# Patient Record
Sex: Male | Born: 1965
Health system: Southern US, Community
[De-identification: ages and names within clinical notes are randomized; demographics above are authoritative.]

## PROBLEM LIST (undated history)

## (undated) DIAGNOSIS — F329 Major depressive disorder, single episode, unspecified: Secondary | ICD-10-CM

## (undated) DIAGNOSIS — Z8619 Personal history of other infectious and parasitic diseases: Secondary | ICD-10-CM

## (undated) DIAGNOSIS — K219 Gastro-esophageal reflux disease without esophagitis: Secondary | ICD-10-CM

## (undated) DIAGNOSIS — M549 Dorsalgia, unspecified: Secondary | ICD-10-CM

## (undated) DIAGNOSIS — G8929 Other chronic pain: Secondary | ICD-10-CM

## (undated) DIAGNOSIS — F32A Depression, unspecified: Secondary | ICD-10-CM

## (undated) HISTORY — DX: Major depressive disorder, single episode, unspecified: F32.9

## (undated) HISTORY — DX: Depression, unspecified: F32.A

## (undated) HISTORY — PX: NOSE SURGERY: SHX723

## (undated) HISTORY — PX: KNEE SURGERY: SHX244

## (undated) HISTORY — DX: Gastro-esophageal reflux disease without esophagitis: K21.9

## (undated) HISTORY — DX: Personal history of other infectious and parasitic diseases: Z86.19

---

## 1998-01-29 ENCOUNTER — Emergency Department (HOSPITAL_COMMUNITY): Admission: EM | Admit: 1998-01-29 | Discharge: 1998-01-29 | Payer: Self-pay | Admitting: Emergency Medicine

## 1998-08-19 ENCOUNTER — Emergency Department (HOSPITAL_COMMUNITY): Admission: EM | Admit: 1998-08-19 | Discharge: 1998-08-19 | Payer: Self-pay | Admitting: Emergency Medicine

## 1998-08-19 ENCOUNTER — Encounter: Payer: Self-pay | Admitting: Emergency Medicine

## 1998-08-20 ENCOUNTER — Encounter: Payer: Self-pay | Admitting: Orthopedic Surgery

## 1998-08-20 ENCOUNTER — Ambulatory Visit (HOSPITAL_COMMUNITY): Admission: RE | Admit: 1998-08-20 | Discharge: 1998-08-20 | Payer: Self-pay | Admitting: Orthopedic Surgery

## 2000-10-26 ENCOUNTER — Encounter: Admission: RE | Admit: 2000-10-26 | Discharge: 2000-10-26 | Payer: Self-pay | Admitting: Family Medicine

## 2002-01-07 ENCOUNTER — Encounter: Admission: RE | Admit: 2002-01-07 | Discharge: 2002-01-07 | Payer: Self-pay | Admitting: Family Medicine

## 2002-02-04 ENCOUNTER — Encounter: Admission: RE | Admit: 2002-02-04 | Discharge: 2002-02-04 | Payer: Self-pay | Admitting: Family Medicine

## 2003-03-17 ENCOUNTER — Encounter: Admission: RE | Admit: 2003-03-17 | Discharge: 2003-03-17 | Payer: Self-pay | Admitting: Family Medicine

## 2003-04-01 ENCOUNTER — Emergency Department (HOSPITAL_COMMUNITY): Admission: EM | Admit: 2003-04-01 | Discharge: 2003-04-01 | Payer: Self-pay | Admitting: *Deleted

## 2003-04-01 ENCOUNTER — Encounter: Payer: Self-pay | Admitting: Emergency Medicine

## 2003-04-07 ENCOUNTER — Encounter: Admission: RE | Admit: 2003-04-07 | Discharge: 2003-04-16 | Payer: Self-pay | Admitting: Orthopaedic Surgery

## 2004-09-29 ENCOUNTER — Emergency Department (HOSPITAL_COMMUNITY): Admission: EM | Admit: 2004-09-29 | Discharge: 2004-09-29 | Payer: Self-pay | Admitting: Emergency Medicine

## 2005-03-12 ENCOUNTER — Emergency Department (HOSPITAL_COMMUNITY): Admission: EM | Admit: 2005-03-12 | Discharge: 2005-03-12 | Payer: Self-pay | Admitting: Emergency Medicine

## 2006-01-17 ENCOUNTER — Emergency Department (HOSPITAL_COMMUNITY): Admission: EM | Admit: 2006-01-17 | Discharge: 2006-01-18 | Payer: Self-pay | Admitting: Emergency Medicine

## 2006-12-08 ENCOUNTER — Inpatient Hospital Stay (HOSPITAL_COMMUNITY): Admission: EM | Admit: 2006-12-08 | Discharge: 2006-12-08 | Payer: Self-pay | Admitting: *Deleted

## 2009-05-03 ENCOUNTER — Emergency Department (HOSPITAL_COMMUNITY): Admission: EM | Admit: 2009-05-03 | Discharge: 2009-05-03 | Payer: Self-pay | Admitting: Emergency Medicine

## 2009-08-11 ENCOUNTER — Ambulatory Visit: Payer: Self-pay | Admitting: Diagnostic Radiology

## 2009-08-11 ENCOUNTER — Emergency Department (HOSPITAL_BASED_OUTPATIENT_CLINIC_OR_DEPARTMENT_OTHER): Admission: EM | Admit: 2009-08-11 | Discharge: 2009-08-11 | Payer: Self-pay | Admitting: Emergency Medicine

## 2009-08-12 ENCOUNTER — Emergency Department (HOSPITAL_BASED_OUTPATIENT_CLINIC_OR_DEPARTMENT_OTHER): Admission: EM | Admit: 2009-08-12 | Discharge: 2009-08-12 | Payer: Self-pay | Admitting: Emergency Medicine

## 2010-09-28 ENCOUNTER — Emergency Department (HOSPITAL_BASED_OUTPATIENT_CLINIC_OR_DEPARTMENT_OTHER)
Admission: EM | Admit: 2010-09-28 | Discharge: 2010-09-28 | Disposition: A | Discharge status: Home / Self Care | Payer: Self-pay | Attending: Emergency Medicine | Admitting: Emergency Medicine

## 2010-09-28 ENCOUNTER — Emergency Department (INDEPENDENT_AMBULATORY_CARE_PROVIDER_SITE_OTHER): Payer: Self-pay

## 2010-09-28 DIAGNOSIS — K2990 Gastroduodenitis, unspecified, without bleeding: Secondary | ICD-10-CM | POA: Insufficient documentation

## 2010-09-28 DIAGNOSIS — K2970 Gastritis, unspecified, without bleeding: Secondary | ICD-10-CM | POA: Insufficient documentation

## 2010-09-28 DIAGNOSIS — R0789 Other chest pain: Secondary | ICD-10-CM | POA: Insufficient documentation

## 2010-09-28 DIAGNOSIS — R079 Chest pain, unspecified: Secondary | ICD-10-CM

## 2010-09-28 LAB — BASIC METABOLIC PANEL
BUN: 17 mg/dL (ref 6–23)
Calcium: 10.4 mg/dL (ref 8.4–10.5)
Chloride: 107 mEq/L (ref 96–112)
Creatinine, Ser: 1.4 mg/dL (ref 0.4–1.5)
GFR calc non Af Amer: 56 mL/min — ABNORMAL LOW (ref >60)
Glucose, Bld: 112 mg/dL — ABNORMAL HIGH (ref 70–99)
Potassium: 3.9 mEq/L (ref 3.5–5.1)

## 2010-09-28 LAB — POCT CARDIAC MARKERS
CKMB, poc: 2 ng/mL (ref 1.0–8.0)
Troponin i, poc: <0.05 ng/mL (ref 0.00–0.09)
Troponin i, poc: <0.05 ng/mL (ref 0.00–0.09)

## 2010-09-28 LAB — CBC
Hemoglobin: 15.6 g/dL (ref 13.0–17.0)
MCH: 30.1 pg (ref 26.0–34.0)
MCV: 84.6 fL (ref 78.0–100.0)
RBC: 5.18 MIL/uL (ref 4.22–5.81)

## 2010-09-28 LAB — URINALYSIS, ROUTINE W REFLEX MICROSCOPIC
Nitrite: NEGATIVE
Protein, ur: 100 mg/dL — AB
Specific Gravity, Urine: 1.039 — ABNORMAL HIGH (ref 1.005–1.030)

## 2010-09-28 LAB — DIFFERENTIAL
Basophils Absolute: 0 10*3/uL (ref 0.0–0.1)
Eosinophils Relative: 1 % (ref 0–5)
Lymphocytes Relative: 29 % (ref 12–46)
Lymphs Abs: 3.4 10*3/uL (ref 0.7–4.0)

## 2010-09-28 LAB — D-DIMER, QUANTITATIVE: D-Dimer, Quant: <0.22 ug/mL-FEU (ref 0.00–0.48)

## 2010-09-28 LAB — URINE MICROSCOPIC-ADD ON

## 2010-11-14 LAB — CBC
HCT: 43.2 % (ref 39.0–52.0)
MCHC: 35.4 g/dL (ref 30.0–36.0)
MCV: 89 fL (ref 78.0–100.0)
Platelets: 346 10*3/uL (ref 150–400)
RDW: 12.8 % (ref 11.5–15.5)

## 2010-11-14 LAB — URINALYSIS, ROUTINE W REFLEX MICROSCOPIC
Bilirubin Urine: NEGATIVE
Leukocytes, UA: NEGATIVE
Nitrite: NEGATIVE
Protein, ur: 100 mg/dL — AB
Urobilinogen, UA: 0.2 mg/dL (ref 0.0–1.0)
pH: 5.5 (ref 5.0–8.0)

## 2010-11-14 LAB — COMPREHENSIVE METABOLIC PANEL
ALT: 46 U/L (ref 0–53)
AST: 32 U/L (ref 0–37)
Albumin: 4.8 g/dL (ref 3.5–5.2)
Alkaline Phosphatase: 60 U/L (ref 39–117)
BUN: 12 mg/dL (ref 6–23)
GFR calc non Af Amer: >60 mL/min (ref >60)
Total Protein: 8.3 g/dL (ref 6.0–8.3)

## 2010-11-14 LAB — DIFFERENTIAL
Basophils Absolute: 0.2 10*3/uL — ABNORMAL HIGH (ref 0.0–0.1)
Basophils Relative: 1 % (ref 0–1)
Eosinophils Relative: 1 % (ref 0–5)
Lymphs Abs: 1.9 10*3/uL (ref 0.7–4.0)
Monocytes Relative: 4 % (ref 3–12)
Neutrophils Relative %: 79 % — ABNORMAL HIGH (ref 43–77)

## 2010-11-18 LAB — DIFFERENTIAL
Basophils Relative: 1 % (ref 0–1)
Lymphocytes Relative: 34 % (ref 12–46)
Neutro Abs: 5 10*3/uL (ref 1.7–7.7)

## 2010-11-18 LAB — COMPREHENSIVE METABOLIC PANEL
AST: 30 U/L (ref 0–37)
CO2: 25 mEq/L (ref 19–32)
Chloride: 106 mEq/L (ref 96–112)
Creatinine, Ser: 1.22 mg/dL (ref 0.4–1.5)
Glucose, Bld: 104 mg/dL — ABNORMAL HIGH (ref 70–99)
Sodium: 137 mEq/L (ref 135–145)
Total Bilirubin: 0.7 mg/dL (ref 0.3–1.2)

## 2010-11-18 LAB — URINE MICROSCOPIC-ADD ON

## 2010-11-18 LAB — URINALYSIS, ROUTINE W REFLEX MICROSCOPIC
Ketones, ur: NEGATIVE mg/dL
Protein, ur: 100 mg/dL — AB
Specific Gravity, Urine: 1.024 (ref 1.005–1.030)
Urobilinogen, UA: 0.2 mg/dL (ref 0.0–1.0)

## 2010-11-18 LAB — CBC
MCHC: 35.5 g/dL (ref 30.0–36.0)
MCV: 90.7 fL (ref 78.0–100.0)
Platelets: 340 10*3/uL (ref 150–400)
RBC: 4.85 MIL/uL (ref 4.22–5.81)
RDW: 13.1 % (ref 11.5–15.5)

## 2010-11-18 LAB — LIPASE, BLOOD: Lipase: 33 U/L (ref 11–59)

## 2010-12-30 NOTE — H&P (Signed)
NAMETYRELL, SEIFER NO.:  [id number]   MEDICAL RECORD NO.:  [id number]          PATIENT TYPE:  EMS   LOCATION:  [facility]                         FACILITY:  [facility]   PHYSICIAN:  Lonia Blood, M.D.       DATE OF BIRTH:  May 24, 1966   DATE OF ADMISSION:  12/07/2006  DATE OF DISCHARGE:                              HISTORY & PHYSICAL   PRIMARY CARE PHYSICIAN:  None.   CHIEF COMPLAINTS:  Chest pain.   HISTORY OF PRESENT ILLNESS:  Mr. Wah is a 45 year old gentleman  without any significant past medical history who tonight was smoking  some crack when all of the sudden started having retrosternal chest  pressure associated with diaphoresis and dyspnea.  The patient became  apprehensive and called 9-1-1.  EMS promptly arrived to give him a  nitroglycerin sublingually and the patient's chest pain improved  immediately. The patient was brought to the emergency room and currently  he is without any chest pain.  The patient smokes a pack of cigarettes a  day.  He does not have any strong family history for early coronary  artery disease.   PAST MEDICAL HISTORY:  None.   MEDICATIONS:  None.   SOCIAL HISTORY:  The patient smokes one pack of cigarettes a day.  He  drinks alcohol, about a six pack of beer a day.  He is married with two  children.  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   FAMILY HISTORY:  The patient's mother is alive at age 59 without  significant medical problems.  The patient's father is alive at age 45  without significant medical problems.  The patient has a brother and a  sister both healthy.   REVIEW OF SYSTEMS:  As per HPI, otherwise positive for some occasional  right-sided abdominal cramps and for some joint pains and minor bruises  and cuts related to the patient's heavy physical activity.   PHYSICAL EXAMINATION UPON ADMISSION:  VITAL SIGNS:  Temperature of 98.1,  blood pressure 124/77, pulse 73, respirations 18, saturation 95% on room   air.  GENERAL:  Well-developed, well-nourished gentleman in no acute distress  lying on stretcher, alert and oriented to place, person and time.  HEENT:  Head normocephalic, atraumatic.  The head is shaved at about 0.5  cm length of hair. Eyes, pupils equal round and reactive light and  accommodation.  Extraocular movement is intact.  Throat clear.  NECK:  Supple.  No JVD.  No carotid bruits.  CHEST:  Clear to auscultation bilaterally without wheezes, rhonchi or  crackles.  HEART:  Regular rate and rhythm without murmurs, rubs or gallops.  ABDOMEN:  Soft, nontender.  Bowel sounds are present.  EXTREMITIES:  Lower extremities have no edema.  SKIN:  Warm and dry without any suspicious rashes.   LABORATORY VALUES AT ADMISSION:  EKG is with normal sinus rhythm and  without any ST-T changes.   Sodium is 137, potassium 3.7, chloride 108, bicarbonate is 21, alcohol  is less than 5, myoglobin 250. CK-MB 1.74, troponin I 0.05   ASSESSMENT/PLAN:  Chest pain most  likely secondary to coronary spasm  secondary to cocaine use.  The patient will be observed for 23 hours on  telemetry.  We will obtain serial cardiac enzymes.  We will further risk  stratify the patient by obtaining a fasting lipid panel.  If the  patient's EKG suffers changes and/or he has got recurrent chest pain  and/or he is Troponin I becomes positive, he will need a cardiologic  referral for a stress test. If not, he will need education in regards to  cocaine abuse.  He will need strong tobacco cessation counseling, an  aspirin every day and further risk reduction as needed.      Lonia Blood, M.D.  Electronically Signed     SL/MEDQ  D:  12/08/2006  T:  12/08/2006  Job:  [id number]

## 2011-01-27 IMAGING — CT CT PELVIS W/ CM
2 of 4 series · 16 of 46 positions shown, 18 images · IV contrast (APPLIED)
Comparison: None.

CT ABDOMEN

CLINICAL DATA: Sick, body pain, left lower quadrant pain

CT ABDOMEN AND PELVIS WITH CONTRAST
TECHNIQUE: Multidetector CT imaging of the abdomen and pelvis was
performed using the standard protocol following bolus
administration of intravenous contrast.
Contrast: 100 mlOmnipaque 300

[Series 2: abd/pelvis 5.0 b31f · axial · 0.70mm/px · z∈[-491,-71]mm · 13 of 93 slices shown, 15 images]
[im 5/93  soft-tissue]
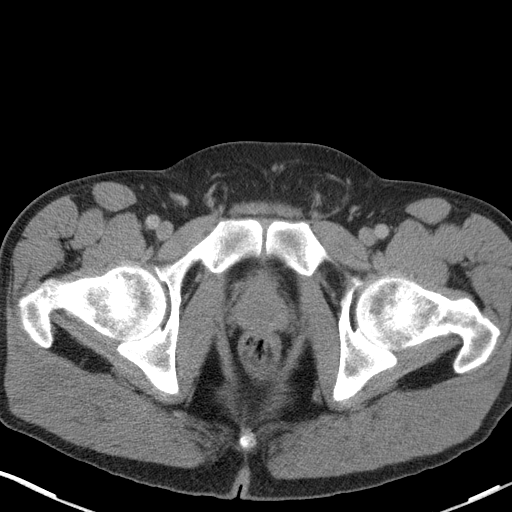
[im 5/93  bone]
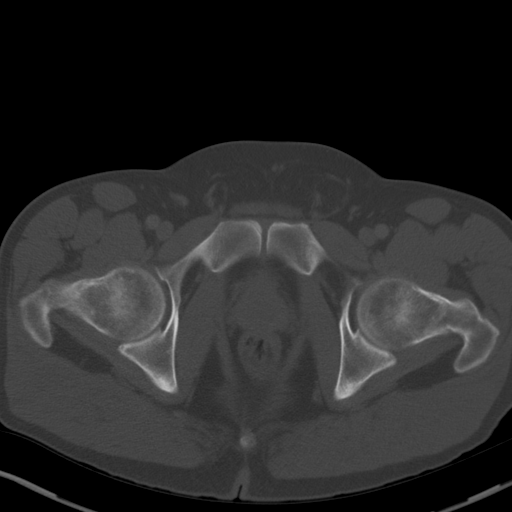
[im 13/93  soft-tissue]
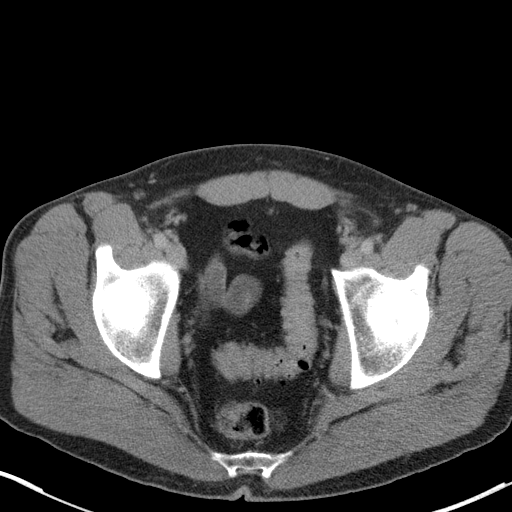
[im 21/93  soft-tissue]
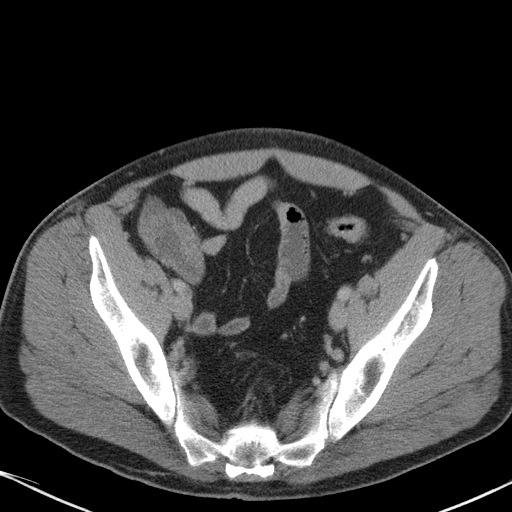
[im 25/93  soft-tissue]
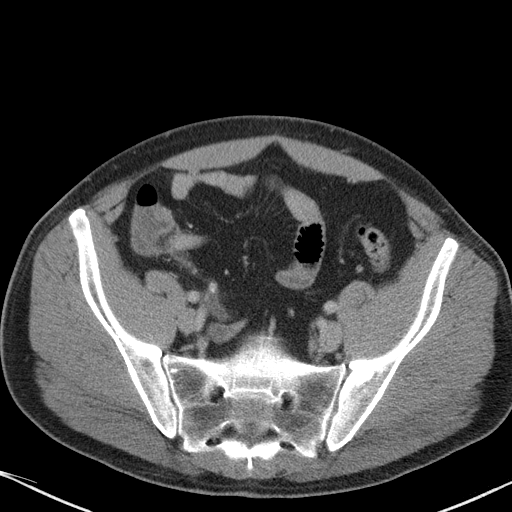
[im 33/93  soft-tissue]
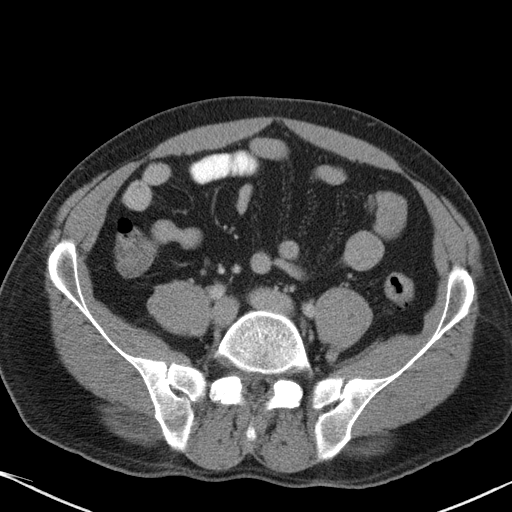
[im 41/93  soft-tissue]
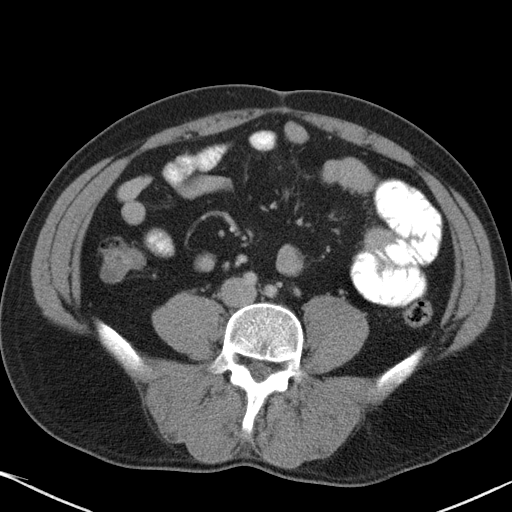
[im 49/93  soft-tissue]
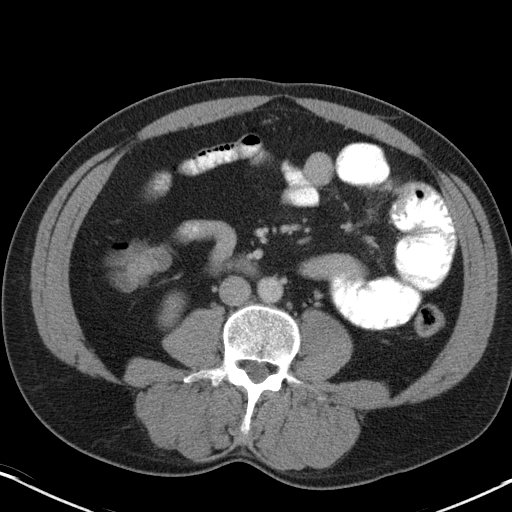
[im 53/93  soft-tissue]
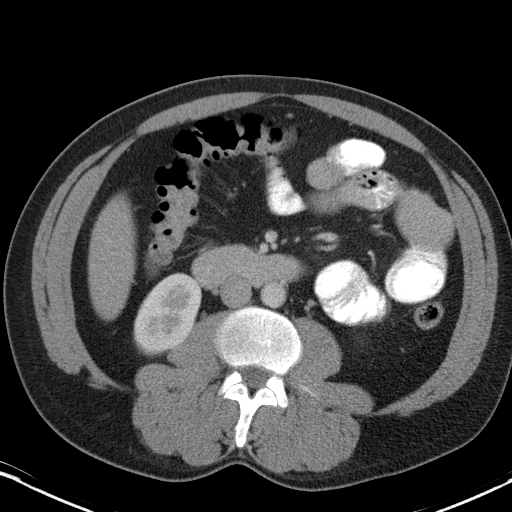
[im 61/93  soft-tissue]
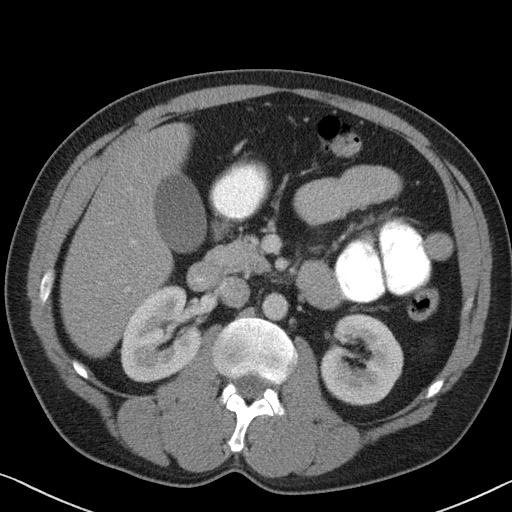
[im 61/93  bone]
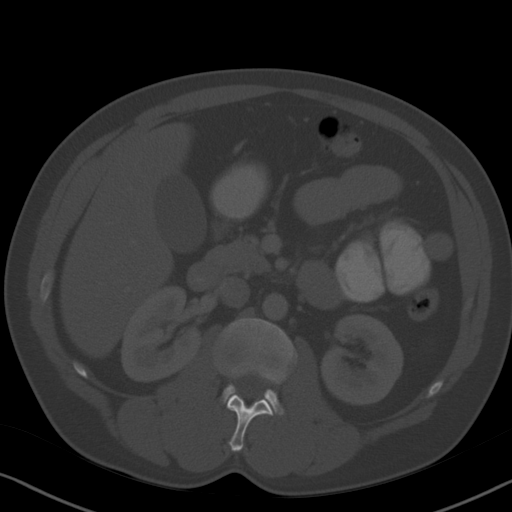
[im 69/93  soft-tissue]
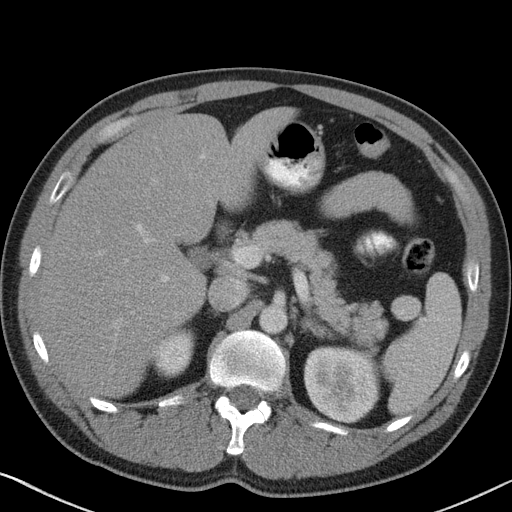
[im 73/93  soft-tissue]
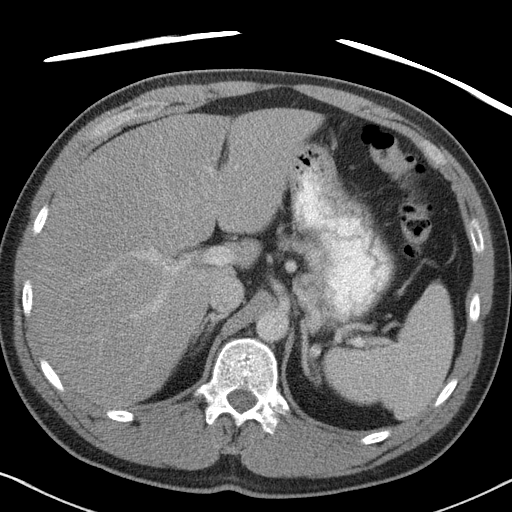
[im 81/93  soft-tissue]
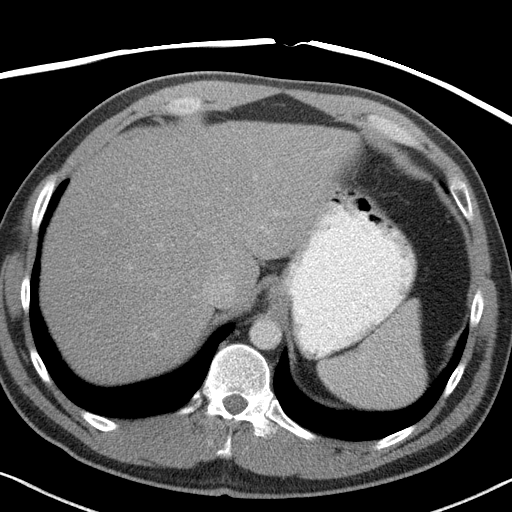
[im 89/93  soft-tissue]
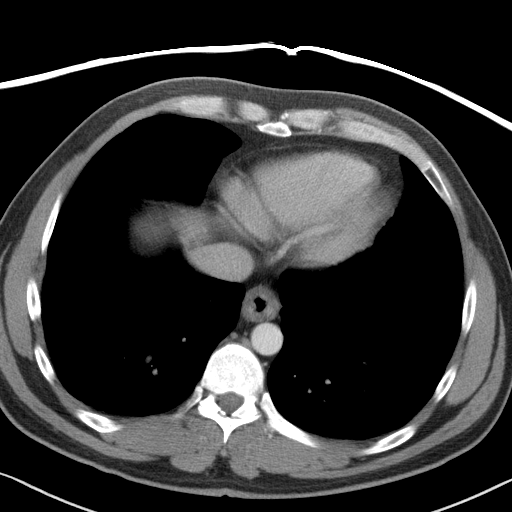

[Series 5: abd/pelvis 3.0 coronal · coronal · 0.71mm/px · 3 of 99 slices shown]
[im 33/99  soft-tissue]
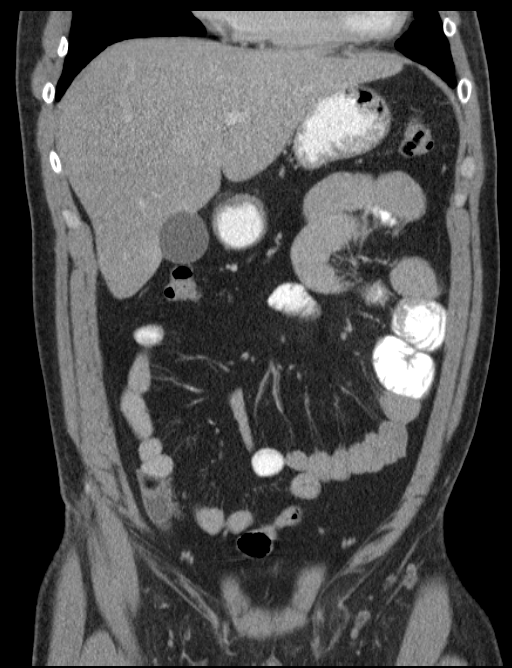
[im 44/99  soft-tissue]
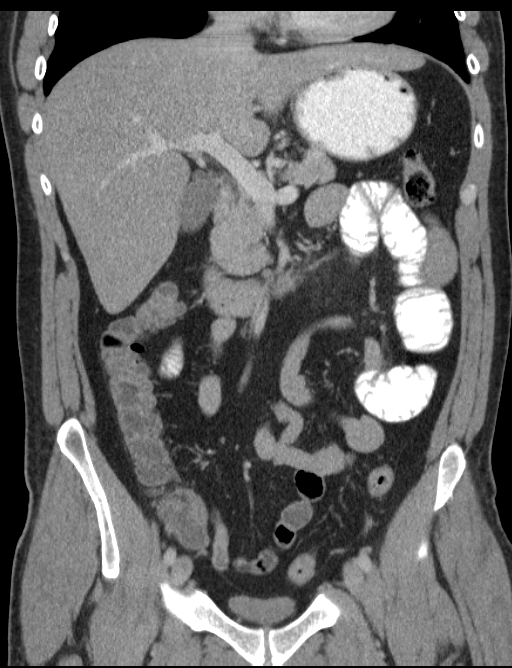
[im 55/99  soft-tissue]
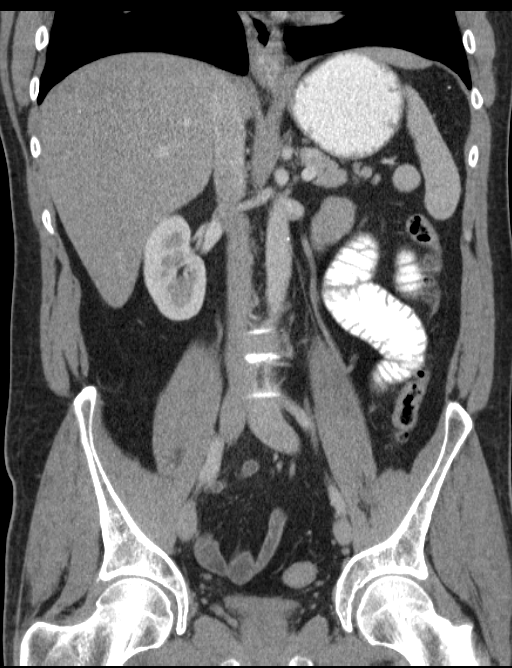

[16 of 46 positions shown; findings below may reference images not displayed]

FINDINGS: Lung bases are clear.  The base of the heart appears
normal.

No focal hepatic lesion the gallbladder, pancreas, spleen, adrenal
glands, and kidneys appear normal.

The stomach, duodenum, small bowel, and cecum appear normal.  The
appendix is fluid-filled and mildly dilated at 8 mm (typical size
is to 6 mm or less).  No clear periappendiceal inflammation.  The
appendix extends midline.  Colon appears normal.

Abdominal aorta is normal caliber.  No retroperitoneal or
peritoneal lymphadenopathy.
IMPRESSION: 1.  No clear acute abdominal process.
2.  A fluid filled mildly dilated appendix which extends midline
without significant periappendiceal inflammation.  If the patient
has leukocytosis and fever recommend follow-up CT scan to exclude
early appendicitis.

CT PELVIS
FINDINGS: The bladder and prostate appear normal.  The rectum
appears normal.  Diverticula of the sigmoid colon without evidence
acute inflammation.

No evidence of pelvic lymphadenopathy.  There is small bilateral
fat filled inguinal hernias.

Review of  bone windows demonstrates no aggressive osseous lesions.
IMPRESSION: Diverticulosis without evidence of acute diverticulitis.

## 2011-02-25 ENCOUNTER — Emergency Department (HOSPITAL_BASED_OUTPATIENT_CLINIC_OR_DEPARTMENT_OTHER)
Admission: EM | Admit: 2011-02-25 | Discharge: 2011-02-25 | Disposition: A | Discharge status: Home / Self Care | Payer: Worker's Compensation | Attending: Emergency Medicine | Admitting: Emergency Medicine

## 2011-02-25 ENCOUNTER — Emergency Department (INDEPENDENT_AMBULATORY_CARE_PROVIDER_SITE_OTHER): Payer: Worker's Compensation

## 2011-02-25 ENCOUNTER — Encounter: Payer: Self-pay | Admitting: *Deleted

## 2011-02-25 DIAGNOSIS — Y990 Civilian activity done for income or pay: Secondary | ICD-10-CM | POA: Insufficient documentation

## 2011-02-25 DIAGNOSIS — S81811A Laceration without foreign body, right lower leg, initial encounter: Secondary | ICD-10-CM

## 2011-02-25 DIAGNOSIS — M79609 Pain in unspecified limb: Secondary | ICD-10-CM

## 2011-02-25 DIAGNOSIS — S81809A Unspecified open wound, unspecified lower leg, initial encounter: Secondary | ICD-10-CM | POA: Insufficient documentation

## 2011-02-25 DIAGNOSIS — S81009A Unspecified open wound, unspecified knee, initial encounter: Secondary | ICD-10-CM

## 2011-02-25 DIAGNOSIS — X58XXXA Exposure to other specified factors, initial encounter: Secondary | ICD-10-CM

## 2011-02-25 DIAGNOSIS — W268XXA Contact with other sharp object(s), not elsewhere classified, initial encounter: Secondary | ICD-10-CM | POA: Insufficient documentation

## 2011-02-25 MED ORDER — KETOROLAC TROMETHAMINE 60 MG/2ML IM SOLN
INTRAMUSCULAR | Status: AC
  Filled 2011-02-25: qty 2

## 2011-02-25 MED ORDER — BACITRACIN 500 UNIT/GM EX OINT
TOPICAL_OINTMENT | Freq: Two times a day (BID) | CUTANEOUS | Status: DC
  Administered 2011-02-25: 1 via TOPICAL

## 2011-02-25 MED ORDER — OXYCODONE-ACETAMINOPHEN 5-325 MG PO TABS: 1.0000 | ORAL_TABLET | ORAL | Status: AC | PRN

## 2011-02-25 MED ORDER — IBUPROFEN 600 MG PO TABS: 600.0000 mg | ORAL_TABLET | Freq: Four times a day (QID) | ORAL | Status: AC | PRN

## 2011-02-25 MED ORDER — KETOROLAC TROMETHAMINE 60 MG/2ML IM SOLN
60.0000 mg | Freq: Once | INTRAMUSCULAR | Status: AC
  Administered 2011-02-25: 60 mg via INTRAMUSCULAR

## 2011-02-25 MED ORDER — BACITRACIN 500 UNIT/GM EX OINT: TOPICAL_OINTMENT | Freq: Two times a day (BID) | CUTANEOUS | Status: DC

## 2011-02-25 NOTE — ED Notes (Signed)
Pt had a work injury yesterday afternoon. States an aluminum door cut into his right anterior lower leg. Was seen at an Urgent care and received stiches and told to f/u in a couple weeks for removal. Pt is here tonight for pain control. States he was not prescribed anything for the pain.

## 2011-02-25 NOTE — ED Provider Notes (Signed)
History     Chief Complaint  Patient presents with  . Leg Pain   HPI Comments: Patient is a 45 year old male who presents approximately 8 hours after sustaining an injury to his right anterior leg at work today. He describes the injury as a laceration of the anterior mid lower extremity which occurred when a heavy aluminum object struck his leg at work. He was seen initially at the urgent care where he was repaired with sutures. He describes ongoing pain in the leg which he feels is abnormal at this time. He denies swelling, fevers, redness, drainage from the wound. He has been ambulatory in a normal fashion. Symptoms are constant, moderate, worse with palpation of the mid lower extremity.  The history is provided by the patient.    History reviewed. No pertinent past medical history.  Past Surgical History  Procedure Date  . Nose surgery     No family history on file.  History  Substance Use Topics  . Smoking status: Former Games developer  . Smokeless tobacco: Not on file  . Alcohol Use: Yes     occassionally      Review of Systems  Constitutional: Negative for fever and chills.  Musculoskeletal: Negative for joint swelling and gait problem.  Skin:       Laceration    Physical Exam  BP 111/82  Pulse 60  Temp(Src) 98.9 F (37.2 C) (Oral)  Resp 18  SpO2 100%  Physical Exam  Constitutional: He appears well-developed and well-nourished. No distress.  HENT:  Head: Normocephalic and atraumatic.  Eyes: Conjunctivae are normal. No scleral icterus.  Pulmonary/Chest: Effort normal.  Musculoskeletal: Normal range of motion. He exhibits tenderness. He exhibits no edema.       Has mild mid anterior lower extremity tenderness on the right associated with a laceration. This is semicircular, sutures are intact and there is no surrounding drainage, erythema, swelling. Patient has normal range of motion of the right foot and ankle without pain.  Neurological:       Gait, motor,  sensation normal.  Skin: Skin is warm and dry. He is not diaphoretic.       As noted in above exam., No rashes seen.    ED Course  Procedures  MDM Patient is overall well-appearing without any abnormal vital signs. He is able to void without an antalgic gait. He has not received any pain medication from his urgent care visit. I have given him pain medication here and prescriptions to take home. I do not believe that there are any complications of his wound at this time and he is aware of the need for followup at appropriate intervals for recheck of his wound.      Vida Roller, MD 02/25/11 0330

## 2011-10-24 ENCOUNTER — Telehealth: Payer: Self-pay | Admitting: Family

## 2011-10-24 ENCOUNTER — Encounter: Payer: Self-pay | Admitting: Family

## 2011-10-24 ENCOUNTER — Ambulatory Visit (INDEPENDENT_AMBULATORY_CARE_PROVIDER_SITE_OTHER): Payer: BC Managed Care – PPO | Admitting: Family

## 2011-10-24 ENCOUNTER — Ambulatory Visit (HOSPITAL_BASED_OUTPATIENT_CLINIC_OR_DEPARTMENT_OTHER)
Admission: RE | Admit: 2011-10-24 | Discharge: 2011-10-24 | Disposition: A | Discharge status: Home / Self Care | Payer: BC Managed Care – PPO | Source: Ambulatory Visit | Attending: Family | Admitting: Family

## 2011-10-24 DIAGNOSIS — R209 Unspecified disturbances of skin sensation: Secondary | ICD-10-CM

## 2011-10-24 DIAGNOSIS — M79606 Pain in leg, unspecified: Secondary | ICD-10-CM

## 2011-10-24 DIAGNOSIS — M79603 Pain in arm, unspecified: Secondary | ICD-10-CM

## 2011-10-24 DIAGNOSIS — F341 Dysthymic disorder: Secondary | ICD-10-CM

## 2011-10-24 DIAGNOSIS — K219 Gastro-esophageal reflux disease without esophagitis: Secondary | ICD-10-CM

## 2011-10-24 DIAGNOSIS — M79609 Pain in unspecified limb: Secondary | ICD-10-CM

## 2011-10-24 DIAGNOSIS — R519 Headache, unspecified: Secondary | ICD-10-CM | POA: Insufficient documentation

## 2011-10-24 DIAGNOSIS — R51 Headache: Secondary | ICD-10-CM

## 2011-10-24 DIAGNOSIS — M79602 Pain in left arm: Secondary | ICD-10-CM

## 2011-10-24 DIAGNOSIS — G8929 Other chronic pain: Secondary | ICD-10-CM

## 2011-10-24 DIAGNOSIS — M549 Dorsalgia, unspecified: Secondary | ICD-10-CM | POA: Insufficient documentation

## 2011-10-24 DIAGNOSIS — F418 Other specified anxiety disorders: Secondary | ICD-10-CM

## 2011-10-24 DIAGNOSIS — R0789 Other chest pain: Secondary | ICD-10-CM

## 2011-10-24 MED ORDER — MELOXICAM 7.5 MG PO TABS: 7.5000 mg | ORAL_TABLET | Freq: Every day | ORAL | Status: DC

## 2011-10-24 MED ORDER — OMEPRAZOLE 40 MG PO CPDR: 40.0000 mg | DELAYED_RELEASE_CAPSULE | Freq: Every day | ORAL | Status: DC

## 2011-10-24 MED ORDER — CITALOPRAM HYDROBROMIDE 20 MG PO TABS: 20.0000 mg | ORAL_TABLET | Freq: Every day | ORAL | Status: DC

## 2011-10-24 NOTE — Telephone Encounter (Signed)
Attempted to reach pt, per wife he is at work. Notified her of below results.

## 2011-10-24 NOTE — Telephone Encounter (Signed)
Received medical records from [facility] °

## 2011-10-24 NOTE — Assessment & Plan Note (Addendum)
Suspect cervical disc disease.  Obtain plain film of C spine. Trial of mobic short term.

## 2011-10-24 NOTE — Assessment & Plan Note (Signed)
Sounds like musculoskeletal pain.  Denies DOE, or CP with exertion.  EKG is performed today in the office and I have personally reviewed it.  He is noted to have sinus bradycardia without ischemic changes.  Monitor for now.

## 2011-10-24 NOTE — Assessment & Plan Note (Signed)
I suspect that this is actually due to poor dentition/toothache rather than HA.  He is scheduled to establish with a Dentist in mid April.

## 2011-10-24 NOTE — Progress Notes (Signed)
Subjective:    Patient ID: Billy White, male    DOB: 06-16-1966, 46 y.o.   MRN: [id number]  HPI  Mr.  Yutzy is a 46 yr old male who presents today to establish care.  1) Leg pain- reports intermittent left leg numbness and pain.  He reports no improvement with ibuprofen.  Reports that this has been off/on for several years.  Has prolonged standing at work. Denies significant low back pain.   2) GERD- uses rolaids/tums- "like its candy." Bothers him every day.  4) Depression-reports that he is easily tearful.  Has taken xanax in th past. He reports every day use.  Panic attacks.  He reports that he sleeps ok.  Denies suicide ideation.    5) Headaches- left cheek pain.  Thinks that it is related to his poor dentition.    6) L arm pain/numbness- this has been ongoing for several years as well.     Review of Systems  Constitutional: Negative for unexpected weight change.  HENT: Negative for hearing loss.   Eyes: Negative for visual disturbance.  Respiratory: Negative for cough and shortness of breath.   Cardiovascular: Negative for leg swelling.  Gastrointestinal: Negative for nausea, vomiting and diarrhea.  Genitourinary: Negative for dysuria, frequency and hematuria.  Musculoskeletal:       See HPI  Neurological: Negative for dizziness.  Hematological: Negative for adenopathy.  Psychiatric/Behavioral:       See HPI   Past Medical History  Diagnosis Date  . History of chicken pox   . Depression   . GERD (gastroesophageal reflux disease)     History   Social History  . Marital Status: Married    Spouse Name: N/A    Number of Children: 2  . Years of Education: N/A   Occupational History  .  [employer]   Social History Main Topics  . Smoking status: Former Games developer  . Smokeless tobacco: Never Used  . Alcohol Use: Yes     occassionally  . Drug Use: 7 per week    Special: Marijuana  . Sexually Active: Not on file   Other Topics Concern  . 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 Nose surgery     Family History  Problem Relation Age of Onset  . Arthritis Father   . Hypertension Father     No Known Allergies  No current outpatient prescriptions on file prior to visit.    BP 106/64  Pulse 57  Temp(Src) 98.4 F (36.9 C) (Oral)  Resp 16  Ht 6' 6.5" (1.994 m)  Wt 198 lb (89.812 kg)  BMI 22.59 kg/m2  SpO2 97%        Objective:   Physical Exam  Constitutional: He is oriented to person, place, and time. He appears well-developed and well-nourished. No distress.  HENT:  Head: Normocephalic and atraumatic.  Right Ear: Tympanic membrane and ear canal normal.  Left Ear: Tympanic membrane and ear canal normal.  Mouth/Throat: No oropharyngeal exudate, posterior oropharyngeal edema or posterior oropharyngeal erythema.       Very poor dentition noted.  Cardiovascular: Normal rate and regular rhythm.   No murmur heard. Pulmonary/Chest: Effort normal and breath sounds normal. No respiratory distress. He has no wheezes. He has no rales. He exhibits no tenderness.  Abdominal: Soft. Bowel sounds  are normal.  Musculoskeletal: He exhibits no edema.  Lymphadenopathy:    He has no cervical adenopathy.  Neurological: He is alert and oriented to person, place, and time. No cranial nerve deficit.  Skin: Skin is warm and dry. No rash noted. No erythema. No pallor.  Psychiatric: He has a normal mood and affect. His behavior is normal. Judgment and thought content normal.          Assessment & Plan:

## 2011-10-24 NOTE — Telephone Encounter (Signed)
Pls call pt and let him know that his neck x-ray looks normal.  Lumbar spine film shows mild arthritis.

## 2011-10-24 NOTE — Assessment & Plan Note (Signed)
Will plan to treat with PPI.

## 2011-10-24 NOTE — Assessment & Plan Note (Addendum)
Suspect lumbar disc disease.  Will plan short term treatment with meloxicam and will also plan to obtain plan films of lumbar spine.  Trial of mobic short term.

## 2011-10-24 NOTE — Assessment & Plan Note (Signed)
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  Wants to quit.  Will start citalopram once daily.  Side effects discussed with pt as well as rare risk of SI.  He is instructed to start with 1/2 tab once daily and increase to full tablet once daily on week two.

## 2011-10-24 NOTE — Patient Instructions (Signed)
Please follow up in 1 month. Complete your x-rays on the first floor. Welcome to [facility]!

## 2011-10-25 ENCOUNTER — Telehealth: Payer: Self-pay | Admitting: Family

## 2011-10-25 DIAGNOSIS — M79603 Pain in arm, unspecified: Secondary | ICD-10-CM

## 2011-10-25 DIAGNOSIS — M79606 Pain in leg, unspecified: Secondary | ICD-10-CM

## 2011-10-25 MED ORDER — METHYLPREDNISOLONE 4 MG PO KIT: PACK | ORAL | Status: AC

## 2011-10-25 NOTE — Telephone Encounter (Signed)
Patient is wanting to know if Meloxicam is for pain? He states that on his pill bottle it says to take one tablet daily but he has been taking two tablets daily and is still in pain.

## 2011-10-25 NOTE — Telephone Encounter (Signed)
Attempted to reach pt and received message that voice mailbox has not been set up. Left detailed message with pt's wife, Bonita Quin re: instructions below and to call if any questions. She states that pt wanted to know if he should cancel his appt tomorrow with the "head" specialist as he thinks his pain may be coming from his teet. Advised her to have him keep appt tomorrow and f/u with dentist as well. She states he has dental appt on 11/27/11 and will notify pt.

## 2011-10-25 NOTE — Telephone Encounter (Signed)
Yes, meloxicam is for pain.  In place of meloxicam, I will send medrol dose pak to his pharmacy and refer for PT.

## 2011-11-10 ENCOUNTER — Ambulatory Visit: Payer: BC Managed Care – PPO | Admitting: Physical Therapy

## 2011-11-21 ENCOUNTER — Ambulatory Visit: Payer: Self-pay | Admitting: Family

## 2011-11-24 ENCOUNTER — Encounter: Payer: Self-pay | Admitting: Family

## 2011-11-24 ENCOUNTER — Ambulatory Visit (INDEPENDENT_AMBULATORY_CARE_PROVIDER_SITE_OTHER): Payer: BC Managed Care – PPO | Admitting: Family

## 2011-11-24 VITALS — BP 122/74 | HR 57 | Temp 98.7°F | Resp 16 | Ht 78.5 in | Wt 199.1 lb

## 2011-11-24 DIAGNOSIS — F418 Other specified anxiety disorders: Secondary | ICD-10-CM

## 2011-11-24 DIAGNOSIS — M549 Dorsalgia, unspecified: Secondary | ICD-10-CM

## 2011-11-24 DIAGNOSIS — M79605 Pain in left leg: Secondary | ICD-10-CM

## 2011-11-24 DIAGNOSIS — F341 Dysthymic disorder: Secondary | ICD-10-CM

## 2011-11-24 DIAGNOSIS — M545 Low back pain: Secondary | ICD-10-CM

## 2011-11-24 MED ORDER — PAROXETINE HCL 20 MG PO TABS: 20.0000 mg | ORAL_TABLET | ORAL | Status: DC

## 2011-11-24 MED ORDER — METHYLPREDNISOLONE 4 MG PO KIT: PACK | ORAL | Status: AC

## 2011-11-24 NOTE — Progress Notes (Signed)
Subjective:    Patient ID: Billy White, male    DOB: 1966/06/08, 46 y.o.   MRN: [id number]  HPI  Mr.  Mallek is a 46 yr old male who present today for follow up.   Leg pain- reports that he is taking 10 tabs of ibuprofen without improvement.  Pain radiates down the left leg. At times he has associated numbness of the left leg.  Plain film of the lumbar spine last visit noted mild degenerative changes.  He used mobic without improvement in his pain.  Reports that the pain sometimes brings him to tears at work.   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    Depression/anxiety- moody sometimes per wife.  Reports that he gets angry, gets the "shakes" when he gets worked up.  Has to go outside to calm himself down.   Works until [employer].  Sleeps 4-6 hours a night.  Not tired.   Arm pain- improved.   Also tells me that he had stress test last Friday. Had stress test last week at [facility] med center.    Review of Systems See HPI  Past Medical History  Diagnosis Date  . History of chicken pox   . Depression   . GERD (gastroesophageal reflux disease)     History   Social History  . Marital Status: Married    Spouse Name: N/A    Number of Children: 2  . Years of Education: N/A   Occupational History  .  [employer]   Social History Main Topics  . Smoking status: Former Games developer  . Smokeless tobacco: Never Used  . Alcohol Use: Yes     occassionally  . Drug Use: 7 per week    Special: Marijuana  . Sexually Active: Not on file   Other Topics Concern  . 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 Nose surgery     Family History  Problem Relation Age of Onset  . Arthritis Father   . Hypertension Father     No Known Allergies  Current Outpatient Prescriptions on File Prior to Visit  Medication Sig  Dispense Refill  . omeprazole (PRILOSEC) 40 MG capsule Take 1 capsule (40 mg total) by mouth daily.  30 capsule  3  . PARoxetine (PAXIL) 20 MG tablet Take 1 tablet (20 mg total) by mouth every morning.  30 tablet  1    BP 122/74  Pulse 57  Temp(Src) 98.7 F (37.1 C) (Oral)  Resp 16  Ht 6' 6.5" (1.994 m)  Wt 199 lb 1.9 oz (90.32 kg)  BMI 22.72 kg/m2  SpO2 98%       Objective:   Physical Exam  Constitutional: He is oriented to person, place, and time. He appears well-developed and well-nourished. No distress.  Cardiovascular: Normal rate and regular rhythm.   No murmur heard. Pulmonary/Chest: Effort normal and breath sounds normal. No respiratory distress. He has no wheezes. He has no rales. He exhibits no tenderness.  Musculoskeletal: He exhibits no edema.       Bilateral LE strength is 5/5  Neurological: He is alert and oriented to person, place, and time.  Reflex Scores:      Patellar reflexes are 2+ on the right side and 2+ on the left side. Skin: Skin is warm and dry.  Psychiatric: He has  a normal mood and affect. His behavior is normal. Judgment and thought content normal.          Assessment & Plan:

## 2011-11-24 NOTE — Assessment & Plan Note (Addendum)
Obtain MRI of the L spine. Attempt Medrol dose pak. Cautioned pt against overuse of NSAIDS.

## 2011-11-24 NOTE — Assessment & Plan Note (Signed)
Will try paxil in place of citalopram to see if this has improved effect on his anxiety. He was advised re: short half life and importance of taking every day.

## 2011-11-24 NOTE — Patient Instructions (Signed)
Use tylenol for pain, try to use ibuprofen sparingly.   You will be contacted about your mri. Follow up in 1 month.

## 2011-12-04 ENCOUNTER — Telehealth: Payer: Self-pay | Admitting: Family

## 2011-12-04 DIAGNOSIS — IMO0002 Reserved for concepts with insufficient information to code with codable children: Secondary | ICD-10-CM

## 2011-12-04 NOTE — Telephone Encounter (Signed)
Pls call pt and let him know that I have reviewed his MRI.  It shows a bulging disc which is pressing on his sciatic nerve on the right.  I would like for him to meet with the Neurosurgeon for further eval and treatments.  (pended below)

## 2011-12-04 NOTE — Telephone Encounter (Signed)
Notified pt and advised him our referral coordinator will arrange appt and call him with appt date/time. Pt agreeable to proceed with referral.

## 2011-12-08 ENCOUNTER — Encounter: Payer: Self-pay | Admitting: Family

## 2011-12-29 ENCOUNTER — Encounter: Payer: Self-pay | Admitting: Family

## 2011-12-29 ENCOUNTER — Ambulatory Visit (INDEPENDENT_AMBULATORY_CARE_PROVIDER_SITE_OTHER): Payer: BC Managed Care – PPO | Admitting: Family

## 2011-12-29 DIAGNOSIS — F418 Other specified anxiety disorders: Secondary | ICD-10-CM

## 2011-12-29 DIAGNOSIS — M549 Dorsalgia, unspecified: Secondary | ICD-10-CM

## 2011-12-29 DIAGNOSIS — F341 Dysthymic disorder: Secondary | ICD-10-CM

## 2011-12-29 LAB — TSH: TSH: 2.358 u[IU]/mL (ref 0.350–4.500)

## 2011-12-29 MED ORDER — PAROXETINE HCL 30 MG PO TABS: 30.0000 mg | ORAL_TABLET | ORAL | Status: DC

## 2011-12-29 NOTE — Progress Notes (Signed)
  Subjective:    Patient ID: NAREG BREIGHNER, male    DOB: 1966-02-06, 46 y.o.   MRN: [id number]  HPI  Mr.  Cress is a 46 yr old male who presents today for follow up.  1) Anxiety-  Pt reports that he is tolerating paxil without any difficulty.  He denies any associated somnolence.  Reports that he still has some nervousness, but that he is better able to get through his day at work.  Feels that the paxil is working better than citalopram was.  2) Low back pain- he is now under the care of [facility] and is using flexeril PRN and vicodin PRN with only slight improvement in his symptoms.  He was told that he would need surgery but he declines surgery at this time.  Did not have much improvement from medrol dose pak.    Review of Systems See HPI  Past Medical History  Diagnosis Date  . History of chicken pox   . Depression   . GERD (gastroesophageal reflux disease)     History   Social History  . Marital Status: Married    Spouse Name: N/A    Number of Children: 2  . Years of Education: N/A   Occupational History  .  [employer]   Social History Main Topics  . Smoking status: Former Games developer  . Smokeless tobacco: Never Used  . Alcohol Use: Yes     occassionally  . Drug Use: 7 per week    Special: Marijuana  . Sexually Active: Not on file   Other Topics Concern  . 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 Nose surgery     Family History  Problem Relation Age of Onset  . Arthritis Father   . Hypertension Father     No Known Allergies  Current Outpatient Prescriptions on File Prior to Visit  Medication Sig Dispense Refill  . ibuprofen (ADVIL,MOTRIN) 200 MG tablet Pt currently taking 10 tablets daily.      Marland Kitchen omeprazole (PRILOSEC) 40 MG capsule Take 1 capsule (40 mg total) by mouth  daily.  30 capsule  3  . DISCONTD: PARoxetine (PAXIL) 20 MG tablet Take 1 tablet (20 mg total) by mouth every morning.  30 tablet  1    BP 113/68  Pulse 59  Temp(Src) 97.8 F (36.6 C) (Oral)  Resp 12  Ht 6' 6.5" (1.994 m)  Wt 196 lb 0.6 oz (88.923 kg)  BMI 22.37 kg/m2  SpO2 99%       Objective:   Physical Exam  Constitutional: He appears well-developed and well-nourished. No distress.  HENT:  Head: Normocephalic and atraumatic.  Cardiovascular: Normal rate and regular rhythm.   No murmur heard. Pulmonary/Chest: Effort normal and breath sounds normal. No respiratory distress. He has no wheezes. He has no rales. He exhibits no tenderness.  Musculoskeletal: He exhibits no edema.  Neurological: Coordination normal.  Skin: Skin is warm and dry.  Psychiatric: He has a normal mood and affect. His behavior is normal. Judgment and thought content normal.          Assessment & Plan:

## 2011-12-29 NOTE — Assessment & Plan Note (Signed)
Improving- but anxiety persists.  Will try increasing paxil to 30 mg.  Plan follow up in 2 months.

## 2011-12-29 NOTE — Patient Instructions (Signed)
Complete your blood work prior to leaving today.  Please follow up in 2 months, sooner if problems or concerns.

## 2011-12-29 NOTE — Assessment & Plan Note (Signed)
Defer management to neurosurgery- currently on med management with flexeril and vicodin.

## 2011-12-31 ENCOUNTER — Encounter: Payer: Self-pay | Admitting: Family

## 2012-01-22 ENCOUNTER — Other Ambulatory Visit: Payer: Self-pay | Admitting: Family

## 2012-01-22 NOTE — Telephone Encounter (Signed)
Paroxetine request denied as dose was changed on 12/29/11 and new rx was sent to pharmacy. Sent note to check Rx on file.

## 2012-03-07 ENCOUNTER — Ambulatory Visit: Payer: Self-pay | Admitting: Family

## 2012-03-08 ENCOUNTER — Ambulatory Visit: Payer: Self-pay | Admitting: Family

## 2012-03-15 ENCOUNTER — Ambulatory Visit: Payer: Self-pay | Admitting: Family

## 2012-03-18 ENCOUNTER — Other Ambulatory Visit: Payer: Self-pay | Admitting: Neurosurgery

## 2012-03-18 ENCOUNTER — Ambulatory Visit (INDEPENDENT_AMBULATORY_CARE_PROVIDER_SITE_OTHER): Payer: BC Managed Care – PPO | Admitting: Family

## 2012-03-18 ENCOUNTER — Encounter: Payer: Self-pay | Admitting: Family

## 2012-03-18 VITALS — BP 118/80 | HR 55 | Temp 97.9°F | Resp 18 | Ht 78.5 in | Wt 203.1 lb

## 2012-03-18 DIAGNOSIS — F341 Dysthymic disorder: Secondary | ICD-10-CM

## 2012-03-18 DIAGNOSIS — F418 Other specified anxiety disorders: Secondary | ICD-10-CM

## 2012-03-18 DIAGNOSIS — M545 Low back pain, unspecified: Secondary | ICD-10-CM

## 2012-03-18 MED ORDER — ALPRAZOLAM 0.25 MG PO TABS: 0.2500 mg | ORAL_TABLET | Freq: Two times a day (BID) | ORAL | Status: DC | PRN

## 2012-03-18 MED ORDER — PAROXETINE HCL 40 MG PO TABS: 40.0000 mg | ORAL_TABLET | ORAL | Status: DC

## 2012-03-18 NOTE — Patient Instructions (Addendum)
Please follow up in 2 months, sooner if problems/concerns.  

## 2012-03-18 NOTE — Progress Notes (Signed)
  Subjective:    Patient ID: Billy White, male    DOB: 07/22/1966, 46 y.o.   MRN: [id number]  HPI  Anxiety- Mr.  Popoff is a 46 yr old male who presents today for follow up of his anxiety.  He was last seen in May and his paxil was increased from 20mg  to 30mg .  He has gained about 7 pounds during this time. Reports that he did not notice much improvement with his anxiety with the increase in his dose.  Took a few of his mom's xanax.  Helped "a little."   Reports that he feels shaky every day, even on days off.  Rarely drinks ETOH.  Review of Systems See HPI  Past Medical History  Diagnosis Date  . History of chicken pox   . Depression   . GERD (gastroesophageal reflux disease)     History   Social History  . Marital Status: Married    Spouse Name: N/A    Number of Children: 2  . Years of Education: N/A   Occupational History  .  [employer]   Social History Main Topics  . Smoking status: Former Games developer  . Smokeless tobacco: Never Used  . Alcohol Use: Yes     occassionally  . Drug Use: 7 per week    Special: Marijuana  . Sexually Active: Not on file   Other Topics Concern  . 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 Nose surgery     Family History  Problem Relation Age of Onset  . Arthritis Father   . Hypertension Father     No Known Allergies  Current Outpatient Prescriptions on File Prior to Visit  Medication Sig Dispense Refill  . cyclobenzaprine (FLEXERIL) 10 MG tablet Take 10 mg by mouth at bedtime as needed.      Marland Kitchen HYDROcodone-acetaminophen (VICODIN) 5-500 MG per tablet Take 1 tablet by mouth every 6 (six) hours as needed.      Marland Kitchen ibuprofen (ADVIL,MOTRIN) 200 MG tablet Pt currently taking 10 tablets daily.      Marland Kitchen omeprazole (PRILOSEC) 40 MG capsule Take 1 capsule (40 mg total) by  mouth daily.  30 capsule  3  . PARoxetine (PAXIL) 30 MG tablet Take 1 tablet (30 mg total) by mouth every morning.  30 tablet  2    BP 118/80  Pulse 55  Temp 97.9 F (36.6 C) (Oral)  Resp 18  Ht 6' 6.5" (1.994 m)  Wt 203 lb 1.9 oz (92.135 kg)  BMI 23.17 kg/m2  SpO2 98%       Objective:   Physical Exam  Constitutional: He appears well-developed and well-nourished. No distress.  Psychiatric: He has a normal mood and affect. His behavior is normal. Thought content normal.          Assessment & Plan:

## 2012-03-18 NOTE — Assessment & Plan Note (Signed)
Anxiety is unchanged despite increase in prozac from 20mg  to 30mg .  Will increase prozac to 40mg .  We discussed watching his weight to avoid further weight gain.  Add prn xanax. Advised pt to use sparingly. A controlled substance contract was signed today.

## 2012-03-21 ENCOUNTER — Telehealth: Payer: Self-pay | Admitting: *Deleted

## 2012-03-21 NOTE — Telephone Encounter (Signed)
He can use xanax as needed for anxiety today. Resume paxil as soon as procedure is completed. Some dizziness can occur as side effect of holding paxil, should resolve after he restarts med tomorrow.

## 2012-03-21 NOTE — Telephone Encounter (Signed)
Notified pt and he voices understanding. Pt also reports that his stools have been dark x 2 weeks. Denies rectal bleeding or bright red blood with bowel movements and has not noticed any other symptoms. Pt wants to know if his medications could be causing this? Pt states he is only taking ibuprofen as needed and not every day as he is currently on hydrocodone.  Please advise.

## 2012-03-21 NOTE — Telephone Encounter (Signed)
Received call from pt stating he is having a procedure tomorrow on his back , ?myelogram and he was told to stop his paxil for three days prior to the procedure. Pt states he has been off of it x 2 days and he is starting to feel light headed. Reports that he felt this way once before when he stopped the medication for 6 days and had to be hospitalized due to the dizziness. Pt states he is taking xanax at bedtime. Denies any other symptoms.  Please advise.

## 2012-03-22 ENCOUNTER — Ambulatory Visit
Admission: RE | Admit: 2012-03-22 | Discharge: 2012-03-22 | Disposition: A | Discharge status: Home / Self Care | Payer: No Typology Code available for payment source | Source: Ambulatory Visit | Attending: Neurosurgery | Admitting: Neurosurgery

## 2012-03-22 VITALS — BP 115/65 | HR 58 | Ht 79.0 in | Wt 203.0 lb

## 2012-03-22 DIAGNOSIS — M549 Dorsalgia, unspecified: Secondary | ICD-10-CM

## 2012-03-22 DIAGNOSIS — M545 Low back pain: Secondary | ICD-10-CM

## 2012-03-22 MED ORDER — IOHEXOL 180 MG/ML  SOLN
15.0000 mL | Freq: Once | INTRAMUSCULAR | Status: AC | PRN
  Administered 2012-03-22: 15 mL via INTRATHECAL

## 2012-03-22 MED ORDER — DIAZEPAM 5 MG PO TABS
10.0000 mg | ORAL_TABLET | Freq: Once | ORAL | Status: AC
  Administered 2012-03-22: 10 mg via ORAL

## 2012-03-22 MED ORDER — OXYCODONE-ACETAMINOPHEN 5-325 MG PO TABS
1.0000 | ORAL_TABLET | Freq: Once | ORAL | Status: AC
  Administered 2012-03-22: 2 via ORAL

## 2012-03-22 NOTE — Progress Notes (Signed)
Patient states he has been off Paxil/Paroxetine for the past two days.  Larina Earthly, RN

## 2012-03-22 NOTE — Progress Notes (Signed)
Pt states he has been off paxil for the past 3 days.

## 2012-03-22 NOTE — Telephone Encounter (Signed)
I don't think pt can arrange an appt today as he is having a myelogram. Is it ok to schedule him for Monday? Please advise.

## 2012-03-22 NOTE — Telephone Encounter (Signed)
Schedule for Monday, but if continued dark stools over weekend, he should be seen in urgent care.

## 2012-03-22 NOTE — Telephone Encounter (Signed)
I think he should be evaluated today to see if the dark stools are due to blood.  If no room at [facility], please see if someone can evaluate him at [facility]. If not, he should go to Urgent Care.  He should stop ibuprofen.

## 2012-03-22 NOTE — Telephone Encounter (Signed)
Notified pt and he voices understanding. Scheduled f/u for Monday at 10:45am.

## 2012-03-25 ENCOUNTER — Ambulatory Visit: Payer: Self-pay | Admitting: Family

## 2012-04-04 ENCOUNTER — Other Ambulatory Visit: Payer: Self-pay | Admitting: Family

## 2012-04-04 NOTE — Telephone Encounter (Signed)
Is this ok to refill?Marland Kitchen..04/04/12@10 :56am/LMB

## 2012-04-05 NOTE — Telephone Encounter (Signed)
OK to send #60 no refills.  Instructions should read, one tablet twice daily as needed for anxiety.

## 2012-04-05 NOTE — Telephone Encounter (Signed)
Called refill unto pharmacy spoke with [facility] gave authorization. Epic updated... 04/05/12@1 :39pm/LMB

## 2012-04-30 ENCOUNTER — Emergency Department (HOSPITAL_BASED_OUTPATIENT_CLINIC_OR_DEPARTMENT_OTHER): Payer: BC Managed Care – PPO

## 2012-04-30 ENCOUNTER — Emergency Department (HOSPITAL_BASED_OUTPATIENT_CLINIC_OR_DEPARTMENT_OTHER)
Admission: EM | Admit: 2012-04-30 | Discharge: 2012-04-30 | Disposition: A | Discharge status: Home / Self Care | Payer: BC Managed Care – PPO | Attending: Emergency Medicine | Admitting: Emergency Medicine

## 2012-04-30 ENCOUNTER — Encounter (HOSPITAL_BASED_OUTPATIENT_CLINIC_OR_DEPARTMENT_OTHER): Payer: Self-pay | Admitting: *Deleted

## 2012-04-30 DIAGNOSIS — F329 Major depressive disorder, single episode, unspecified: Secondary | ICD-10-CM | POA: Insufficient documentation

## 2012-04-30 DIAGNOSIS — F3289 Other specified depressive episodes: Secondary | ICD-10-CM | POA: Insufficient documentation

## 2012-04-30 DIAGNOSIS — S62306A Unspecified fracture of fifth metacarpal bone, right hand, initial encounter for closed fracture: Secondary | ICD-10-CM

## 2012-04-30 DIAGNOSIS — S62339A Displaced fracture of neck of unspecified metacarpal bone, initial encounter for closed fracture: Secondary | ICD-10-CM | POA: Insufficient documentation

## 2012-04-30 DIAGNOSIS — Z87891 Personal history of nicotine dependence: Secondary | ICD-10-CM | POA: Insufficient documentation

## 2012-04-30 DIAGNOSIS — K219 Gastro-esophageal reflux disease without esophagitis: Secondary | ICD-10-CM | POA: Insufficient documentation

## 2012-04-30 DIAGNOSIS — W2209XA Striking against other stationary object, initial encounter: Secondary | ICD-10-CM | POA: Insufficient documentation

## 2012-04-30 MED ORDER — HYDROCODONE-ACETAMINOPHEN 5-500 MG PO TABS: 1.0000 | ORAL_TABLET | Freq: Four times a day (QID) | ORAL | Status: DC | PRN
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
## 2012-05-01 NOTE — ED Provider Notes (Signed)
Medical screening examination/treatment/procedure(s) were performed by non-physician practitioner and as supervising physician I was immediately available for consultation/collaboration.  Zareya Tuckett, MD 05/01/12 0703 

## 2012-05-07 ENCOUNTER — Other Ambulatory Visit: Payer: Self-pay | Admitting: Family

## 2012-05-07 NOTE — Telephone Encounter (Signed)
Alprazolam request [Last Rx 08.22.13 #60x0]/SLS Please advise. 

## 2012-05-07 NOTE — Telephone Encounter (Signed)
Rx called to Caroline at [facility].

## 2012-05-07 NOTE — Telephone Encounter (Signed)
OK to send #60 with zero refills. 

## 2012-05-20 ENCOUNTER — Ambulatory Visit: Payer: Self-pay | Admitting: Family

## 2012-05-28 ENCOUNTER — Ambulatory Visit: Payer: BC Managed Care – PPO | Admitting: Family

## 2012-05-29 ENCOUNTER — Ambulatory Visit: Payer: BC Managed Care – PPO | Admitting: Family

## 2012-05-29 DIAGNOSIS — Z0289 Encounter for other administrative examinations: Secondary | ICD-10-CM

## 2012-06-05 ENCOUNTER — Other Ambulatory Visit: Payer: Self-pay | Admitting: Family

## 2012-06-05 NOTE — Telephone Encounter (Signed)
Medication refill request. Pt last office visit was 03/18/12. Pt cancelled 2 mo follow up appt on 05/20/12 and No Show on 05/29/12. Please advise.

## 2012-06-05 NOTE — Telephone Encounter (Signed)
Medication refill request. Pt last office visit 03/18/12. Pt cancelled follow up on 05/20/12 and was a No Show on 05/29/12. Please advise.

## 2012-06-06 NOTE — Telephone Encounter (Signed)
No further refills until seen.

## 2012-07-01 ENCOUNTER — Other Ambulatory Visit: Payer: Self-pay | Admitting: Family

## 2012-07-01 NOTE — Telephone Encounter (Signed)
2 week supply of paxil sent to [facility]. Please is past due for follow up. Please call pt to arrange appt as further refills cannot be given until he is seen in the office.

## 2012-07-03 ENCOUNTER — Encounter: Payer: Self-pay | Admitting: Family

## 2012-07-03 ENCOUNTER — Ambulatory Visit (INDEPENDENT_AMBULATORY_CARE_PROVIDER_SITE_OTHER): Payer: BC Managed Care – PPO | Admitting: Family

## 2012-07-03 VITALS — BP 114/66 | HR 78 | Temp 98.3°F | Resp 16 | Ht 78.5 in | Wt 206.0 lb

## 2012-07-03 DIAGNOSIS — M549 Dorsalgia, unspecified: Secondary | ICD-10-CM

## 2012-07-03 DIAGNOSIS — F341 Dysthymic disorder: Secondary | ICD-10-CM

## 2012-07-03 DIAGNOSIS — F418 Other specified anxiety disorders: Secondary | ICD-10-CM

## 2012-07-03 MED ORDER — PAROXETINE HCL 40 MG PO TABS: 40.0000 mg | ORAL_TABLET | ORAL | Status: DC

## 2012-07-03 MED ORDER — VENLAFAXINE HCL 75 MG PO TABS: 75.0000 mg | ORAL_TABLET | Freq: Two times a day (BID) | ORAL | Status: DC

## 2012-07-03 MED ORDER — ALPRAZOLAM 0.25 MG PO TABS: 0.2500 mg | ORAL_TABLET | Freq: Two times a day (BID) | ORAL | Status: DC | PRN

## 2012-07-03 NOTE — Assessment & Plan Note (Signed)
Will continue paxil.  Add effexor once daily x 3 days, then increase to bid. Refill xanax.  If no improvement in 1 month, plan referral to psychiatry.

## 2012-07-03 NOTE — Patient Instructions (Addendum)
Please keep your upcoming apt with Neurosurgery. Follow up in 1 month.  Call if worsening depression/anxiety. Go to ER if you develop thoughts of hurting yourself or others.

## 2012-07-03 NOTE — Telephone Encounter (Signed)
Informed patient that a 2 week supply of paxil has been sent to pharmacy and patient scheduled an appt for this afternoon.

## 2012-07-03 NOTE — Progress Notes (Signed)
  Subjective:    Patient ID: Billy White, male    DOB: Jan 29, 1966, 46 y.o.   MRN: [id number]  HPI   Billy White is a 46 yr old male who presents today for follow up.  1) Anxiety- using xanax tid instead of BID.  Reports that he has been out x 1 month. Still feels nervous.  Using paxil, but not sure it is helping.  He continues to smoke marijuana q3 days.   2) Depression- "always depressed". Harder to get out of bed in the morning.  Wife notes that he can be mood and sometimes she doesn't want to be around him. Denies suicidal ideation or homocidal ideation.      Review of Systems See HPI  Past Medical History  Diagnosis Date  . History of chicken pox   . Depression   . GERD (gastroesophageal reflux disease)     History   Social History  . Marital Status: Married    Spouse Name: N/A    Number of Children: 2  . Years of Education: N/A   Occupational History  .  [employer]   Social History Main Topics  . Smoking status: Former Games developer  . Smokeless tobacco: Never Used  . Alcohol Use: Yes     Comment: occassionally  . Drug Use: 7 per week    Special: Marijuana  . Sexually Active: Not on file   Other Topics Concern  . 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 Nose surgery     Family History  Problem Relation Age of Onset  . Arthritis Father   . Hypertension Father     No Known Allergies  Current Outpatient Prescriptions on File Prior to Visit  Medication Sig Dispense Refill  . cyclobenzaprine (FLEXERIL) 10 MG tablet Take 10 mg by mouth at bedtime as needed.      Marland Kitchen ibuprofen (ADVIL,MOTRIN) 200 MG tablet Pt currently taking 10 tablets daily.      Marland Kitchen omeprazole (PRILOSEC) 40 MG capsule Take 1 capsule (40 mg total) by mouth daily.  30 capsule  3  . [DISCONTINUED] PARoxetine (PAXIL) 40 MG  tablet TAKE ONE TABLET BY MOUTH IN THE MORNING  14 tablet  0  . HYDROcodone-acetaminophen (VICODIN) 5-500 MG per tablet Take 1 tablet by mouth every 6 (six) hours as needed.      Marland Kitchen HYDROcodone-acetaminophen (VICODIN) 5-500 MG per tablet Take 1-2 tablets by mouth every 6 (six) hours as needed for pain.  20 tablet  0  . venlafaxine (EFFEXOR) 75 MG tablet Take 1 tablet (75 mg total) by mouth 2 (two) times daily.  60 tablet  0    BP 114/66  Pulse 78  Temp 98.3 F (36.8 C) (Oral)  Resp 16  Ht 6' 6.5" (1.994 m)  Wt 206 lb (93.441 kg)  BMI 23.50 kg/m2  SpO2 96%       Objective:   Physical Exam  Constitutional: He appears well-developed and well-nourished.  Eyes:       Sclera look red bilaterally  Psychiatric: His behavior is normal. Thought content normal. Cognition and memory are normal.       Flat affect.           Assessment & Plan:

## 2012-07-03 NOTE — Assessment & Plan Note (Signed)
Pt is requesting med for back pain. He is scheduled to follow up with Neurosurgery next week.  I have asked him to address back pain with neurosurgery.

## 2012-07-05 ENCOUNTER — Emergency Department (HOSPITAL_COMMUNITY)
Admission: EM | Admit: 2012-07-05 | Discharge: 2012-07-05 | Disposition: A | Discharge status: Home / Self Care | Payer: BC Managed Care – PPO | Attending: Emergency Medicine | Admitting: Emergency Medicine

## 2012-07-05 ENCOUNTER — Emergency Department (HOSPITAL_COMMUNITY): Payer: BC Managed Care – PPO

## 2012-07-05 ENCOUNTER — Encounter (HOSPITAL_COMMUNITY): Payer: Self-pay | Admitting: *Deleted

## 2012-07-05 DIAGNOSIS — R05 Cough: Secondary | ICD-10-CM | POA: Insufficient documentation

## 2012-07-05 DIAGNOSIS — R059 Cough, unspecified: Secondary | ICD-10-CM | POA: Insufficient documentation

## 2012-07-05 DIAGNOSIS — F1010 Alcohol abuse, uncomplicated: Secondary | ICD-10-CM | POA: Insufficient documentation

## 2012-07-05 DIAGNOSIS — R079 Chest pain, unspecified: Secondary | ICD-10-CM | POA: Insufficient documentation

## 2012-07-05 DIAGNOSIS — G8929 Other chronic pain: Secondary | ICD-10-CM | POA: Insufficient documentation

## 2012-07-05 DIAGNOSIS — Z79899 Other long term (current) drug therapy: Secondary | ICD-10-CM | POA: Insufficient documentation

## 2012-07-05 DIAGNOSIS — R61 Generalized hyperhidrosis: Secondary | ICD-10-CM | POA: Insufficient documentation

## 2012-07-05 DIAGNOSIS — R0602 Shortness of breath: Secondary | ICD-10-CM | POA: Insufficient documentation

## 2012-07-05 DIAGNOSIS — F17200 Nicotine dependence, unspecified, uncomplicated: Secondary | ICD-10-CM | POA: Insufficient documentation

## 2012-07-05 DIAGNOSIS — Z8659 Personal history of other mental and behavioral disorders: Secondary | ICD-10-CM | POA: Insufficient documentation

## 2012-07-05 DIAGNOSIS — F1910 Other psychoactive substance abuse, uncomplicated: Secondary | ICD-10-CM

## 2012-07-05 DIAGNOSIS — Z8719 Personal history of other diseases of the digestive system: Secondary | ICD-10-CM | POA: Insufficient documentation

## 2012-07-05 DIAGNOSIS — R110 Nausea: Secondary | ICD-10-CM | POA: Insufficient documentation

## 2012-07-05 DIAGNOSIS — M549 Dorsalgia, unspecified: Secondary | ICD-10-CM | POA: Insufficient documentation

## 2012-07-05 DIAGNOSIS — Z8619 Personal history of other infectious and parasitic diseases: Secondary | ICD-10-CM | POA: Insufficient documentation

## 2012-07-05 HISTORY — DX: Dorsalgia, unspecified: M54.9

## 2012-07-05 HISTORY — DX: Other chronic pain: G89.29

## 2012-07-05 LAB — BASIC METABOLIC PANEL
BUN: 10 mg/dL (ref 6–23)
Calcium: 9.6 mg/dL (ref 8.4–10.5)
GFR calc Af Amer: >90 mL/min (ref >90)
GFR calc non Af Amer: >90 mL/min (ref >90)
Glucose, Bld: 71 mg/dL (ref 70–99)
Sodium: 137 mEq/L (ref 135–145)

## 2012-07-05 LAB — CBC WITH DIFFERENTIAL/PLATELET
Basophils Relative: 1 % (ref 0–1)
Eosinophils Absolute: 0 10*3/uL (ref 0.0–0.7)
Eosinophils Relative: 1 % (ref 0–5)
Lymphs Abs: 1.9 10*3/uL (ref 0.7–4.0)
MCH: 31.2 pg (ref 26.0–34.0)
MCHC: 34.6 g/dL (ref 30.0–36.0)
MCV: 90.3 fL (ref 78.0–100.0)
Platelets: 289 10*3/uL (ref 150–400)
RBC: 4.45 MIL/uL (ref 4.22–5.81)

## 2012-07-05 LAB — RAPID URINE DRUG SCREEN, HOSP PERFORMED: Barbiturates: NOT DETECTED

## 2012-07-05 LAB — TROPONIN I: Troponin I: <0.3 ng/mL (ref <0.30)

## 2012-07-05 MED ORDER — OXYCODONE-ACETAMINOPHEN 5-325 MG PO TABS
2.0000 | ORAL_TABLET | Freq: Once | ORAL | Status: AC
  Administered 2012-07-05: 2 via ORAL
  Filled 2012-07-05: qty 2

## 2012-07-05 MED ORDER — ONDANSETRON 4 MG PO TBDP
4.0000 mg | ORAL_TABLET | Freq: Once | ORAL | Status: AC
  Administered 2012-07-05: 4 mg via ORAL
  Filled 2012-07-05: qty 1

## 2012-07-05 NOTE — ED Notes (Signed)
C/o left side CP radiating into LUE. Denies SOB, n/v, diaphoresis. Admits to using crack today. Has had non  Prod cough x 3 days.  CP worse with deep breaths, palpation & mvmt. Pt also states has been under a lot of stress lately.

## 2012-07-05 NOTE — ED Provider Notes (Signed)
Medical screening examination/treatment/procedure(s) were conducted as a shared visit with non-physician practitioner(s) and myself.  I personally evaluated the patient during the encounter   Gwyneth Sprout, MD 07/05/12 2337

## 2012-07-05 NOTE — ED Provider Notes (Signed)
History     CSN: [id number]  Arrival date & time 07/05/12  1435   First MD Initiated Contact with Patient 07/05/12 1508      Chief Complaint  Patient presents with  . Chest Pain    (Consider location/radiation/quality/duration/timing/severity/associated sxs/prior treatment) Patient is a 46 y.o. male presenting with chest pain. The history is provided by the patient.  Chest Pain The chest pain began 3 - 5 hours ago. Duration of episode(s) is 3 hours. Chest pain occurs constantly. The chest pain is resolved. The pain is associated with breathing. At its most intense, the pain is at 8/10. The pain is currently at 0/10. The severity of the pain is moderate. The quality of the pain is described as pleuritic and sharp. The pain does not radiate. Exacerbated by: started while sitting. Primary symptoms include shortness of breath, cough and nausea. Pertinent negatives for primary symptoms include no abdominal pain and no vomiting.  Associated symptoms include diaphoresis. He tried nitroglycerin and aspirin for the symptoms. Risk factors: cocaine use today prior to pain starting.  His past medical history is significant for anxiety/panic attacks.  Pertinent negatives for past medical history include no CAD, no COPD, no CHF, no diabetes and no MI.  Pertinent negatives for family medical history include: no CAD in family and no early MI in family. Procedure history comments: had similar event in june and had neg work up but did not have stress test at that time..     Past Medical History  Diagnosis Date  . History of chicken pox   . Depression   . GERD (gastroesophageal reflux disease)   . Chronic back pain     Past Surgical History  Procedure Date  . Nose surgery     Family History  Problem Relation Age of Onset  . Arthritis Father   . Hypertension Father     History  Substance Use Topics  . Smoking status: Current Every Day Smoker    Types: Cigars  . Smokeless tobacco: Never  Used  . Alcohol Use: Yes     Comment:  1 beer daily      Review of Systems  Constitutional: Positive for diaphoresis.  Respiratory: Positive for cough and shortness of breath.   Cardiovascular: Positive for chest pain.  Gastrointestinal: Positive for nausea. Negative for vomiting and abdominal pain.  All other systems reviewed and are negative.    Allergies  Review of patient's allergies indicates no known allergies.  Home Medications   Current Outpatient Rx  Name  Route  Sig  Dispense  Refill  . ALPRAZOLAM 0.25 MG PO TABS   Oral   Take 1 tablet (0.25 mg total) by mouth 2 (two) times daily as needed for sleep.   60 tablet   0   . CYCLOBENZAPRINE HCL 10 MG PO TABS   Oral   Take 10 mg by mouth at bedtime as needed. For spasms         . HYDROCODONE-ACETAMINOPHEN 5-500 MG PO TABS   Oral   Take 1-2 tablets by mouth every 6 (six) hours as needed for pain.   20 tablet   0   . IBUPROFEN 200 MG PO TABS   Oral   Take 800 mg by mouth every 6 (six) hours as needed. For pain         . PAROXETINE HCL 40 MG PO TABS   Oral   Take 1 tablet (40 mg total) by mouth every morning.   30  tablet   2   . VENLAFAXINE HCL 75 MG PO TABS   Oral   Take 1 tablet (75 mg total) by mouth 2 (two) times daily.   60 tablet   0     BP 119/71  Pulse 68  Temp 98.6 F (37 C) (Oral)  Resp 16  Ht 6\' 7"  (2.007 m)  SpO2 99%  Physical Exam  Nursing note and vitals reviewed. Constitutional: He is oriented to person, place, and time. He appears well-developed and well-nourished. No distress.  HENT:  Head: Normocephalic and atraumatic.  Mouth/Throat: Oropharynx is clear and moist.  Eyes: Conjunctivae normal and EOM are normal. Pupils are equal, round, and reactive to light.  Neck: Normal range of motion. Neck supple.  Cardiovascular: Normal rate, regular rhythm and intact distal pulses.   No murmur heard. Pulmonary/Chest: Effort normal. No respiratory distress. He has no wheezes. He has  rhonchi. He has no rales. He exhibits tenderness.       Tenderness with palpation over the sternum and left ribs  Abdominal: Soft. He exhibits no distension. There is no tenderness. There is no rebound and no guarding.  Musculoskeletal: Normal range of motion. He exhibits no edema and no tenderness.  Neurological: He is alert and oriented to person, place, and time.  Skin: Skin is warm and dry. No rash noted. No erythema.  Psychiatric: He has a normal mood and affect. His behavior is normal.    ED Course  Procedures (including critical care time)  Labs Reviewed  URINE RAPID DRUG SCREEN (HOSP PERFORMED) - Abnormal; Notable for the following:    Cocaine POSITIVE (*)     Tetrahydrocannabinol POSITIVE (*)     All other components within normal limits  CBC WITH DIFFERENTIAL  BASIC METABOLIC PANEL  TROPONIN I   Dg Chest 2 View  07/05/2012  *RADIOLOGY REPORT*  Clinical Data: Left-sided chest and arm pain, shortness of breath, diaphoresis, nausea, history of smoking  CHEST - 2 VIEW  Comparison: 09/28/2010; 12/07/2006; 02/13/2005  Findings: Unchanged cardiac silhouette and mediastinal contours. No focal parenchymal opacities.  No pleural effusion or pneumothorax.  Unchanged bones.  IMPRESSION: No acute cardiopulmonary disease.   Original Report Authenticated By: Tacey Ruiz, MD      Date: 07/05/2012  Rate: 66  Rhythm: normal sinus rhythm  QRS Axis: normal  Intervals: normal  ST/T Wave abnormalities: normal  Conduction Disutrbances: none  Narrative Interpretation: unremarkable      No diagnosis found.    MDM   Patient presents complaining of chest pain that started approximately 12:30 today. The pain is now resolved was sharp in nature. He states he smoked cocaine this morning and the pain started after that. Denies having routine pain with cocaine use. He denies any hypertension, hyperlipidemia, diabetes or other cardiac risk factors. He does not take aspirin on a daily basis. He  is well-appearing now chest pain-free. He is satting 99% on room air. Patient does have a history of anxiety but states has never had a panic attack and does not know what that feels like. He is also had a productive cough for the last one week. EKG is within normal limits. He states he had a similar episode to this in June of this year and went to an outside emergency room and was cleared to go home. He does not know what they did at that visit. Feel most likely patient's pain is due to cocaine use today. Will check serial enzymes, EKG is normal,  chest x-ray pending. Will ensure no infectious etiology causing symptoms. PERC negative.  5:08 PM Initially first troponin is within normal limits. All labs negative. Patient went to the CDU to await second troponin      Gwyneth Sprout, MD 07/05/12 1708

## 2012-07-05 NOTE — ED Notes (Signed)
C/o SSCP onset today while at rest. Given ASA 324mg , NTG X 3 with minimal relief.

## 2012-07-05 NOTE — ED Notes (Signed)
Patient transported to X-ray 

## 2012-07-05 NOTE — ED Provider Notes (Signed)
Patient placed in CDU by Gwyneth Sprout, MD for delta troponin.  Patient is here for chest pain after cocaine use and has received labs, imaging, UDS.   Plan per previous provider is to repeat a troponin in discharge home if negative.  Patient re-evaluated and is resting comfortably, VSS, with no new complaints or concerns at this time. On exam: hemodynamically stable, NAD, heart w/ RRR, lungs CTAB, Chest & abd non-tender, no peripheral edema or calf tenderness.   BP 123/77  Pulse 75  Temp 98.6 F (37 C) (Oral)  Resp 19  Ht 6\' 7"  (2.007 m)  SpO2 97%   5:40 PM Pt now c/o back pain and mild nausea.  Pt also with continued L chest pain that is worse with deep breaths, palpation and movement.  This is the same as it has been.  Pt states he usually takes percocet 1-2 tabs at home for back pain.  Ordered same for pain control here along with zofran for nausea control.     7:42 PM Repeat Troponin is negative.  Patient is now pain-free, nausea free and made to be discharged home.  I have discussed the importance of cessation of his crack cocaine use.  1. Medications: Usual medications 2. Treatment: Rest, drink plenty of fluids 3. Follow Up: Primary care physician in regards to today's visit  Dierdre Forth, PA-C 07/05/12 2001

## 2012-07-08 ENCOUNTER — Other Ambulatory Visit: Payer: Self-pay | Admitting: Family

## 2012-07-08 NOTE — Telephone Encounter (Signed)
Left message for pt to return my call. Refill just sent to [facility] pharmacy on 07/03/12. Is he requesting refill? Also notify pt of inability to continue to prescribe controlled substances for him due to violation of contract per Provider.

## 2012-07-09 NOTE — Telephone Encounter (Signed)
Attempted to reach pt and was told he is at work but should be available in the morning. Will try to reach pt at that time.

## 2012-07-09 NOTE — Telephone Encounter (Signed)
Patient returned phone call. Best# [phone]

## 2012-07-10 ENCOUNTER — Ambulatory Visit (INDEPENDENT_AMBULATORY_CARE_PROVIDER_SITE_OTHER): Payer: BC Managed Care – PPO | Admitting: Family

## 2012-07-10 ENCOUNTER — Encounter: Payer: Self-pay | Admitting: Family

## 2012-07-10 VITALS — BP 126/76 | HR 71 | Temp 99.0°F | Resp 16 | Wt 200.0 lb

## 2012-07-10 DIAGNOSIS — F341 Dysthymic disorder: Secondary | ICD-10-CM

## 2012-07-10 DIAGNOSIS — F418 Other specified anxiety disorders: Secondary | ICD-10-CM

## 2012-07-10 MED ORDER — VENLAFAXINE HCL 75 MG PO TABS: 75.0000 mg | ORAL_TABLET | Freq: Two times a day (BID) | ORAL | Status: DC

## 2012-07-10 NOTE — Telephone Encounter (Signed)
Patient returned phone call. Best# [phone]
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
## 2012-07-10 NOTE — Assessment & Plan Note (Addendum)
Depression is unchanged.   I advised him to restart effexor and to give it some time to work.  Continue paxil.I will refer him to psychiatry for further treatment.  I advised him that his use of crack cocaine was in violation with his controlled substance contract and that I would be unable to provide him with any further xanax as a result.  Pt verbalizes understanding.  He is instructed to go to the ER if he develops thoughts of hurting himself or others. 15 minutes spent with the patient today.  >50% of this time was spent counseling pt on his anxiety and depression.

## 2012-07-10 NOTE — Patient Instructions (Signed)
You will be contact about your referral to psychiatry. Please let us know if you have not heard back within 1 week about your referral. Go to the ER if you develop thoughts of hurting yourself or others. Follow up in 1 month.
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
## 2012-08-05 ENCOUNTER — Ambulatory Visit: Payer: BC Managed Care – PPO | Admitting: Family

## 2012-08-05 DIAGNOSIS — Z0289 Encounter for other administrative examinations: Secondary | ICD-10-CM

## 2012-08-23 ENCOUNTER — Other Ambulatory Visit: Payer: Self-pay | Admitting: Internal Medicine

## 2012-08-23 NOTE — Telephone Encounter (Signed)
No refills due to violation of controlled substance contract.

## 2012-08-23 NOTE — Telephone Encounter (Signed)
Alprazolam request [shows D/C 11.27.13 in EMR]/SLS Please advise.

## 2012-08-23 NOTE — Telephone Encounter (Signed)
Rx Denied/SLS

## 2012-09-07 ENCOUNTER — Emergency Department (HOSPITAL_BASED_OUTPATIENT_CLINIC_OR_DEPARTMENT_OTHER)
Admission: EM | Admit: 2012-09-07 | Discharge: 2012-09-08 | Disposition: A | Discharge status: Home / Self Care | Payer: BC Managed Care – PPO | Attending: Emergency Medicine | Admitting: Emergency Medicine

## 2012-09-07 ENCOUNTER — Encounter (HOSPITAL_BASED_OUTPATIENT_CLINIC_OR_DEPARTMENT_OTHER): Payer: Self-pay | Admitting: *Deleted

## 2012-09-07 DIAGNOSIS — R109 Unspecified abdominal pain: Secondary | ICD-10-CM | POA: Insufficient documentation

## 2012-09-07 DIAGNOSIS — M549 Dorsalgia, unspecified: Secondary | ICD-10-CM | POA: Insufficient documentation

## 2012-09-07 DIAGNOSIS — W1809XA Striking against other object with subsequent fall, initial encounter: Secondary | ICD-10-CM | POA: Insufficient documentation

## 2012-09-07 DIAGNOSIS — Z872 Personal history of diseases of the skin and subcutaneous tissue: Secondary | ICD-10-CM | POA: Insufficient documentation

## 2012-09-07 DIAGNOSIS — K219 Gastro-esophageal reflux disease without esophagitis: Secondary | ICD-10-CM | POA: Insufficient documentation

## 2012-09-07 DIAGNOSIS — Y929 Unspecified place or not applicable: Secondary | ICD-10-CM | POA: Insufficient documentation

## 2012-09-07 DIAGNOSIS — Z79899 Other long term (current) drug therapy: Secondary | ICD-10-CM | POA: Insufficient documentation

## 2012-09-07 DIAGNOSIS — G8929 Other chronic pain: Secondary | ICD-10-CM | POA: Insufficient documentation

## 2012-09-07 DIAGNOSIS — F17200 Nicotine dependence, unspecified, uncomplicated: Secondary | ICD-10-CM | POA: Insufficient documentation

## 2012-09-07 DIAGNOSIS — Y939 Activity, unspecified: Secondary | ICD-10-CM | POA: Insufficient documentation

## 2012-09-07 DIAGNOSIS — S20219A Contusion of unspecified front wall of thorax, initial encounter: Secondary | ICD-10-CM | POA: Insufficient documentation

## 2012-09-07 LAB — URINALYSIS, ROUTINE W REFLEX MICROSCOPIC
Leukocytes, UA: NEGATIVE
Nitrite: NEGATIVE
Protein, ur: 30 mg/dL — AB
Urobilinogen, UA: 1 mg/dL (ref 0.0–1.0)

## 2012-09-07 LAB — D-DIMER, QUANTITATIVE: D-Dimer, Quant: <0.27 ug/mL-FEU (ref 0.00–0.48)

## 2012-09-07 MED ORDER — OXYCODONE-ACETAMINOPHEN 5-325 MG PO TABS
1.0000 | ORAL_TABLET | Freq: Once | ORAL | Status: AC
  Administered 2012-09-07: 1 via ORAL
  Filled 2012-09-07 (×2): qty 1

## 2012-09-07 NOTE — ED Notes (Signed)
Pt states he fell about 1-1/2 weeks ago onto his right flank area. Now c/o pain to same.

## 2012-09-07 NOTE — ED Provider Notes (Signed)
History   This chart was scribed for Drae Mitzel Smitty Cords, MD scribed by Magnus Sinning. The patient was seen in room MH02/MH02 at 23:20   CSN: [id number]  Arrival date & time 09/07/12  2236   First MD Initiated Contact with Patient 09/07/12 2304      Chief Complaint  Patient presents with  . Flank Pain    (Consider location/radiation/quality/duration/timing/severity/associated sxs/prior treatment) Patient is a 47 y.o. male presenting with flank pain. The history is provided by the patient. No language interpreter was used.  Flank Pain This is a chronic problem. The current episode started more than 1 week ago. The problem occurs constantly. The problem has not changed since onset.Pertinent negatives include no chest pain, no abdominal pain, no headaches and no shortness of breath. Nothing aggravates the symptoms. Nothing relieves the symptoms. He has tried nothing for the symptoms. The treatment provided no relief.   Billy White is a 47 y.o. male who presents to the Emergency Department complaining of constant moderate pain to right rib, resulting from a fall that occurred 1.5 weeks ago.  Pt says he fell 1.5 weeks ago and he is unsure what object he hit on fall. He says he hasn't taken anything, but relative states he has taken tylenol.   Past Medical History  Diagnosis Date  . History of chicken pox   . Depression   . GERD (gastroesophageal reflux disease)   . Chronic back pain     Past Surgical History  Procedure Date  . Nose surgery     Family History  Problem Relation Age of Onset  . Arthritis Father   . Hypertension Father     History  Substance Use Topics  . Smoking status: Current Every Day Smoker    Types: Cigars  . Smokeless tobacco: Never Used  . Alcohol Use: Yes     Comment:  1 beer daily      Review of Systems  Respiratory: Negative for shortness of breath.   Cardiovascular: Negative for chest pain, palpitations and leg swelling.    Gastrointestinal: Negative for abdominal pain.  Neurological: Negative for headaches.  All other systems reviewed and are negative.    Allergies  Review of patient's allergies indicates no known allergies.  Home Medications   Current Outpatient Rx  Name  Route  Sig  Dispense  Refill  . PAROXETINE HCL 40 MG PO TABS   Oral   Take 1 tablet (40 mg total) by mouth every morning.   30 tablet   2   . VENLAFAXINE HCL 75 MG PO TABS   Oral   Take 1 tablet (75 mg total) by mouth 2 (two) times daily.   60 tablet   0     BP 123/69  Pulse 74  Temp 98.3 F (36.8 C) (Oral)  Resp 20  Ht 6\' 7"  (2.007 m)  Wt 206 lb (93.441 kg)  BMI 23.21 kg/m2  SpO2 97%  Physical Exam  Nursing note and vitals reviewed. Constitutional: He is oriented to person, place, and time. He appears well-developed and well-nourished. No distress.  HENT:  Head: Normocephalic and atraumatic.  Right Ear: No hemotympanum.  Left Ear: No hemotympanum.  Mouth/Throat: Oropharynx is clear and moist. No oropharyngeal exudate.  Eyes: Conjunctivae normal and EOM are normal. Pupils are equal, round, and reactive to light.  Neck: Trachea normal and normal range of motion. Neck supple. Carotid bruit is not present. No tracheal deviation present.  Cardiovascular: Normal rate, regular rhythm and  intact distal pulses.   No murmur heard. Pulmonary/Chest: Effort normal. No stridor. No respiratory distress. He exhibits tenderness.       Right lateral ribs 8-9  Abdominal: Soft. Bowel sounds are normal. He exhibits no distension. There is no tenderness. There is no rebound and no guarding.  Musculoskeletal: Normal range of motion.       Tender at mid axilllary line on 8 and 9 right rib. No step offs or crepitus.  Neurological: He is alert and oriented to person, place, and time. He has normal strength and normal reflexes. No sensory deficit.  Skin: Skin is warm and dry.       No bruising  Psychiatric: He has a normal mood and  affect. His behavior is normal.    ED Course  Procedures (including critical care time) DIAGNOSTIC STUDIES: Oxygen Saturation is 97% on room air, normal by my interpretation.    COORDINATION OF CARE:  23:21: Physical exam performed.  Labs Reviewed  URINALYSIS, ROUTINE W REFLEX MICROSCOPIC - Abnormal; Notable for the following:    Bilirubin Urine SMALL (*)     Protein, ur 30 (*)     All other components within normal limits  D-DIMER, QUANTITATIVE  URINE MICROSCOPIC-ADD ON   No results found.   No diagnosis found.    MDM  Rib contusion.   I personally performed the services described in this documentation, which was scribed in my presence. The recorded information has been reviewed and is accurate.         Jasmine Awe, MD 09/08/12 [phone]

## 2012-09-08 ENCOUNTER — Emergency Department (HOSPITAL_BASED_OUTPATIENT_CLINIC_OR_DEPARTMENT_OTHER): Payer: BC Managed Care – PPO

## 2012-09-08 MED ORDER — HYDROCODONE-ACETAMINOPHEN 5-325 MG PO TABS: 1.0000 | ORAL_TABLET | Freq: Four times a day (QID) | ORAL | Status: DC | PRN

## 2012-09-28 ENCOUNTER — Other Ambulatory Visit: Payer: Self-pay

## 2012-10-09 ENCOUNTER — Other Ambulatory Visit: Payer: Self-pay | Admitting: Family

## 2012-10-09 NOTE — Telephone Encounter (Signed)
OK to send 14 days only.  Needs OV.

## 2012-10-09 NOTE — Telephone Encounter (Signed)
Patient request  refill on medication , Paxil, .last OV 07/10/2012. No Show appt. On 08/05/2013. Please advise.

## 2012-10-28 ENCOUNTER — Ambulatory Visit: Payer: BC Managed Care – PPO | Admitting: Family

## 2012-11-01 ENCOUNTER — Encounter: Payer: Self-pay | Admitting: Family

## 2012-11-01 ENCOUNTER — Ambulatory Visit (INDEPENDENT_AMBULATORY_CARE_PROVIDER_SITE_OTHER): Payer: BC Managed Care – PPO | Admitting: Family

## 2012-11-01 VITALS — BP 112/80 | HR 65 | Temp 98.5°F | Resp 16 | Ht 78.5 in | Wt 220.1 lb

## 2012-11-01 DIAGNOSIS — F418 Other specified anxiety disorders: Secondary | ICD-10-CM

## 2012-11-01 DIAGNOSIS — F341 Dysthymic disorder: Secondary | ICD-10-CM

## 2012-11-01 MED ORDER — FLUOXETINE HCL 40 MG PO CAPS: 40.0000 mg | ORAL_CAPSULE | Freq: Every day | ORAL | Status: DC

## 2012-11-01 NOTE — Progress Notes (Signed)
  Subjective:    Patient ID: Billy White, male    DOB: Sep 05, 1965, 47 y.o.   MRN: [id number]  HPI  [name] is a 47 yr old male who presents today for follow up of his anxiety.  He continues paxil.  He reports that if he stops the paxil "it makes me sick." notes that xanax worked better.    He reports that he is out of work on [employer] due to back injury. He is following with Dr.Kritzer who has prescribed him valium for his back. He may be having back surgery in the next 3-4 weeks.    Review of Systems See HPI  Past Medical History  Diagnosis Date  . History of chicken pox   . Depression   . GERD (gastroesophageal reflux disease)   . Chronic back pain     History   Social History  . Marital Status: Married    Spouse Name: N/A    Number of Children: 2  . Years of Education: N/A   Occupational History  .  [employer]   Social History Main Topics  . Smoking status: Current Every Day Smoker    Types: Cigars  . Smokeless tobacco: Never Used  . Alcohol Use: Yes     Comment:  1 beer daily  . Drug Use: 7.00 per week    Special: Marijuana, Cocaine  . Sexually Active: Not on file   Other Topics Concern  . 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 Nose surgery      Family History  Problem Relation Age of Onset  . Arthritis Father   . Hypertension Father     No Known Allergies  Current Outpatient Prescriptions on File Prior to Visit  Medication Sig Dispense Refill  . PARoxetine (PAXIL) 40 MG tablet TAKE 1 TABLET BY MOUTH EVERY MORNING.  14 tablet  0   No current facility-administered medications on file prior to visit.    BP 112/80  Pulse 65  Temp(Src) 98.5 F (36.9 C) (Oral)  Resp 16  Ht 6' 6.5" (1.994 m)  Wt 220 lb 1.3 oz (99.828 kg)  BMI  25.11 kg/m2  SpO2 97%       Objective:   Physical Exam  Constitutional: He appears well-developed and well-nourished. No distress.  Psychiatric: He has a normal mood and affect. His behavior is normal. Judgment and thought content normal.          Assessment & Plan:  15 minutes spent with pt today. >50% of this time was spent counseling pt on depression/anxiety.

## 2012-11-01 NOTE — Assessment & Plan Note (Signed)
He has actually done much better on SSRI's in terms of his mood and anxiety.  I am concerned that if we discontinue ssri he will have worsening anxiety.  Will try switching to prozac.  As this has a longer half life, he is unlikely to have withdrawal symptoms from missed doses.  Would not add xanax back as he is on valium and has had some substance abuse issues in the past.

## 2012-11-01 NOTE — Patient Instructions (Addendum)
Please follow up in 4-6 weeks.

## 2012-11-27 ENCOUNTER — Other Ambulatory Visit: Payer: Self-pay | Admitting: Orthopaedic Surgery

## 2012-11-27 DIAGNOSIS — M545 Low back pain: Secondary | ICD-10-CM

## 2012-12-04 ENCOUNTER — Ambulatory Visit: Payer: BC Managed Care – PPO | Admitting: Family

## 2012-12-04 DIAGNOSIS — Z0289 Encounter for other administrative examinations: Secondary | ICD-10-CM

## 2012-12-07 ENCOUNTER — Other Ambulatory Visit: Payer: BC Managed Care – PPO

## 2012-12-14 ENCOUNTER — Ambulatory Visit
Admission: RE | Admit: 2012-12-14 | Discharge: 2012-12-14 | Disposition: A | Discharge status: Home / Self Care | Payer: BC Managed Care – PPO | Source: Ambulatory Visit | Attending: Orthopaedic Surgery | Admitting: Orthopaedic Surgery

## 2012-12-14 DIAGNOSIS — M545 Low back pain: Secondary | ICD-10-CM

## 2013-06-19 ENCOUNTER — Other Ambulatory Visit: Payer: Self-pay

## 2014-04-08 ENCOUNTER — Emergency Department (HOSPITAL_BASED_OUTPATIENT_CLINIC_OR_DEPARTMENT_OTHER)
Admission: EM | Admit: 2014-04-08 | Discharge: 2014-04-08 | Disposition: A | Discharge status: Home / Self Care | Payer: BC Managed Care – PPO | Attending: Emergency Medicine | Admitting: Emergency Medicine

## 2014-04-08 ENCOUNTER — Encounter (HOSPITAL_BASED_OUTPATIENT_CLINIC_OR_DEPARTMENT_OTHER): Payer: Self-pay | Admitting: Emergency Medicine

## 2014-04-08 DIAGNOSIS — K029 Dental caries, unspecified: Secondary | ICD-10-CM | POA: Insufficient documentation

## 2014-04-08 DIAGNOSIS — Z8719 Personal history of other diseases of the digestive system: Secondary | ICD-10-CM | POA: Insufficient documentation

## 2014-04-08 DIAGNOSIS — G8929 Other chronic pain: Secondary | ICD-10-CM | POA: Insufficient documentation

## 2014-04-08 DIAGNOSIS — Z792 Long term (current) use of antibiotics: Secondary | ICD-10-CM | POA: Insufficient documentation

## 2014-04-08 DIAGNOSIS — Z8619 Personal history of other infectious and parasitic diseases: Secondary | ICD-10-CM | POA: Insufficient documentation

## 2014-04-08 DIAGNOSIS — F3289 Other specified depressive episodes: Secondary | ICD-10-CM | POA: Insufficient documentation

## 2014-04-08 DIAGNOSIS — K047 Periapical abscess without sinus: Secondary | ICD-10-CM

## 2014-04-08 DIAGNOSIS — K089 Disorder of teeth and supporting structures, unspecified: Secondary | ICD-10-CM | POA: Insufficient documentation

## 2014-04-08 DIAGNOSIS — F329 Major depressive disorder, single episode, unspecified: Secondary | ICD-10-CM | POA: Insufficient documentation

## 2014-04-08 DIAGNOSIS — F17200 Nicotine dependence, unspecified, uncomplicated: Secondary | ICD-10-CM | POA: Insufficient documentation

## 2014-04-08 DIAGNOSIS — Z79899 Other long term (current) drug therapy: Secondary | ICD-10-CM | POA: Insufficient documentation

## 2014-04-08 DIAGNOSIS — K0889 Other specified disorders of teeth and supporting structures: Secondary | ICD-10-CM

## 2014-04-08 MED ORDER — AMOXICILLIN 500 MG PO CAPS: 500.0000 mg | ORAL_CAPSULE | Freq: Three times a day (TID) | ORAL | Status: DC

## 2014-04-08 MED ORDER — TRAMADOL HCL 50 MG PO TABS: 50.0000 mg | ORAL_TABLET | Freq: Four times a day (QID) | ORAL | Status: DC | PRN

## 2014-04-08 MED ORDER — TRAMADOL HCL 50 MG PO TABS
50.0000 mg | ORAL_TABLET | Freq: Once | ORAL | Status: AC
  Administered 2014-04-08: 50 mg via ORAL
  Filled 2014-04-08: qty 1

## 2014-04-08 NOTE — Discharge Instructions (Signed)
Take tramadol as directed as needed for pain. Take antibiotic to completion. Follow up with the dentist.  Dental Pain A tooth ache may be caused by cavities (tooth decay). Cavities expose the nerve of the tooth to air and hot or cold temperatures. It may come from an infection or abscess (also called a boil or furuncle) around your tooth. It is also often caused by dental caries (tooth decay). This causes the pain you are having. DIAGNOSIS  Your caregiver can diagnose this problem by exam. TREATMENT   If caused by an infection, it may be treated with medications which kill germs (antibiotics) and pain medications as prescribed by your caregiver. Take medications as directed.  Only take over-the-counter or prescription medicines for pain, discomfort, or fever as directed by your caregiver.  Whether the tooth ache today is caused by infection or dental disease, you should see your dentist as soon as possible for further care. SEEK MEDICAL CARE IF: The exam and treatment you received today has been provided on an emergency basis only. This is not a substitute for complete medical or dental care. If your problem worsens or new problems (symptoms) appear, and you are unable to meet with your dentist, call or return to this location. SEEK IMMEDIATE MEDICAL CARE IF:   You have a fever.  You develop redness and swelling of your face, jaw, or neck.  You are unable to open your mouth.  You have severe pain uncontrolled by pain medicine. MAKE SURE YOU:   Understand these instructions.  Will watch your condition.  Will get help right away if you are not doing well or get worse. Document Released: 07/31/2005 Document Revised: 10/23/2011 Document Reviewed: 03/18/2008 [facility] Patient Information 2015 [place], [place]. This information is not intended to replace advice given to you by your health care provider. Make sure you discuss any questions you have with your health care provider.  Facial  Infection You have an infection of your face. This requires special attention to help prevent serious problems. Infections in facial wounds can cause poor healing and scars. They can also spread to deeper tissues, especially around the eye. Wound and dental infections can lead to sinusitis, infection of the eye socket, and even meningitis. Permanent damage to the skin, eye, and nervous system may result if facial infections are not treated properly. With severe infections, hospital care for IV antibiotic injections may be needed if they don't respond to oral antibiotics. Antibiotics must be taken for the full course to insure the infection is eliminated. If the infection came from a bad tooth, it may have to be extracted when the infection is under control. Warm compresses may be applied to reduce skin irritation and remove drainage. You might need a tetanus shot now if:  You cannot remember when your last tetanus shot was.  You have never had a tetanus shot.  The object that caused your wound was dirty. If you need a tetanus shot, and you decide not to get one, there is a rare chance of getting tetanus. Sickness from tetanus can be serious. If you got a tetanus shot, your arm may swell, get red and warm to the touch at the shot site. This is common and not a problem. SEEK IMMEDIATE MEDICAL CARE IF:   You have increased swelling, redness, or trouble breathing.  You have a severe headache, dizziness, nausea, or vomiting.  You develop problems with your eyesight.  You have a fever. Document Released: 09/07/2004 Document Revised: 10/23/2011 Document  Reviewed: 07/31/2005 [employer] Patient Information 2015 [place], [place]. This information is not intended to replace advice given to you by your health care provider. Make sure you discuss any questions you have with your health care provider.

## 2014-04-08 NOTE — ED Notes (Signed)
Pt. Reports having a toothache for 1 wk. Has been taking tylenol and ibuprofen and orajel with no improvement. Pt. Reports unable to eat because of pain. 10/10 pain.

## 2014-04-08 NOTE — ED Provider Notes (Signed)
Medical screening examination/treatment/procedure(s) were performed by non-physician practitioner and as supervising physician I was immediately available for consultation/collaboration.  Megan Docherty, MD 04/08/14 1545 

## 2014-04-08 NOTE — ED Provider Notes (Signed)
CSN: [id number]     Arrival date & time 04/08/14  1109 History   First MD Initiated Contact with Patient 04/08/14 1201     Chief Complaint  Patient presents with  . Dental Pain     (Consider location/radiation/quality/duration/timing/severity/associated sxs/prior Treatment) HPI Comments: Patient is a 48 year old male presents to the emergency department complaining of right upper dental pain x1 week. Patient reports he believes a small part of his tooth broke off and has been hurting since. Pain worse with chewing and states he is sensitive to high in the cold. He has tried taking Tylenol and ibuprofen with no relief. He also tried applying Orajel with minimal relief. Pain described as sharp and throbbing rated 10 out of 10. Denies fever, chills or difficulty swallowing. States he believes the right side of his face is slightly swollen.  Patient is a 48 y.o. male presenting with tooth pain. The history is provided by the patient.  Dental Pain Associated symptoms: facial swelling   Associated symptoms: no fever     Past Medical History  Diagnosis Date  . History of chicken pox   . Depression   . GERD (gastroesophageal reflux disease)   . Chronic back pain    Past Surgical History  Procedure Laterality Date  . Nose surgery     Family History  Problem Relation Age of Onset  . Arthritis Father   . Hypertension Father    History  Substance Use Topics  . Smoking status: Current Every Day Smoker    Types: Cigars  . Smokeless tobacco: Never Used  . Alcohol Use: Yes     Comment:  1 beer daily    Review of Systems  Constitutional: Negative for fever.  HENT: Positive for dental problem and facial swelling. Negative for trouble swallowing.   Eyes: Negative.   Cardiovascular: Negative.   Gastrointestinal: Negative.   Neurological: Negative.       Allergies  Review of patient's allergies indicates no known allergies.  Home Medications   Prior to Admission medications     Medication Sig Start Date End Date Taking? Authorizing Provider  amoxicillin (AMOXIL) 500 MG capsule Take 1 capsule (500 mg total) by mouth 3 (three) times daily. 04/08/14   Trevor Mace, PA-C  diazepam (VALIUM) 10 MG tablet Take 10 mg by mouth every 8 (eight) hours as needed. 10/29/12   Historical Provider, MD  FLUoxetine (PROZAC) 40 MG capsule Take 1 capsule (40 mg total) by mouth daily. 11/01/12   Sandford Craze, NP  PARoxetine (PAXIL) 40 MG tablet TAKE 1 TABLET BY MOUTH EVERY MORNING. 10/09/12   Sandford Craze, NP  traMADol (ULTRAM) 50 MG tablet Take 1 tablet (50 mg total) by mouth every 6 (six) hours as needed. 04/08/14   Trevor Mace, PA-C   BP 138/87  Pulse 74  Temp(Src) 97.8 F (36.6 C) (Oral)  Resp 16  Ht  (2.007 m)  Wt 213 lb (96.616 kg)  BMI 23.99 kg/m2  SpO2 100% Physical Exam  Nursing note and vitals reviewed. Constitutional: He is oriented to person, place, and time. He appears well-developed and well-nourished. No distress.  HENT:  Head: Normocephalic and atraumatic.  Mouth/Throat:    Poor dentition. Multiple dental caries.  Eyes: Conjunctivae and EOM are normal.  Neck: Normal range of motion. Neck supple.  Cardiovascular: Normal rate, regular rhythm and normal heart sounds.   Pulmonary/Chest: Effort normal and breath sounds normal.  Musculoskeletal: Normal range of motion. He exhibits no edema.  Neurological:  He is alert and oriented to person, place, and time.  Skin: Skin is warm and dry.  Psychiatric: He has a normal mood and affect. His behavior is normal.    ED Course  Procedures (including critical care time) Labs Review Labs Reviewed - No data to display  Imaging Review No results found.   EKG Interpretation None      MDM   Final diagnoses:  Pain, dental  Dental infection    Dental pain associated with dental infection. No evidence of dental abscess. Patient is afebrile, non toxic appearing and swallowing secretions well. I  gave patient referral to dentist and stressed the importance of dental follow up for ultimate management of dental pain. I will also give amoxicillin and pain control. Patient voices understanding and is agreeable to plan.  Trevor Mace, PA-C 04/08/14 1232

## 2014-05-29 ENCOUNTER — Other Ambulatory Visit: Payer: Self-pay

## 2014-06-30 ENCOUNTER — Telehealth: Payer: Self-pay | Admitting: Family

## 2014-06-30 NOTE — Telephone Encounter (Signed)
Attempted to reach pt. Cell# not accepting calls and home # not in service.
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
## 2014-07-01 NOTE — Telephone Encounter (Signed)
Attempted to reach pt and received message that wireless customer is not available at this time. Mailed letter.

## 2014-07-13 DIAGNOSIS — S82141D Displaced bicondylar fracture of right tibia, subsequent encounter for closed fracture with routine healing: Secondary | ICD-10-CM | POA: Insufficient documentation

## 2014-12-10 ENCOUNTER — Emergency Department (HOSPITAL_BASED_OUTPATIENT_CLINIC_OR_DEPARTMENT_OTHER): Payer: Medicaid Other

## 2014-12-10 ENCOUNTER — Emergency Department (HOSPITAL_BASED_OUTPATIENT_CLINIC_OR_DEPARTMENT_OTHER)
Admission: EM | Admit: 2014-12-10 | Discharge: 2014-12-10 | Disposition: A | Discharge status: Home / Self Care | Payer: Medicaid Other | Attending: Emergency Medicine | Admitting: Emergency Medicine

## 2014-12-10 ENCOUNTER — Encounter (HOSPITAL_BASED_OUTPATIENT_CLINIC_OR_DEPARTMENT_OTHER): Payer: Self-pay | Admitting: *Deleted

## 2014-12-10 DIAGNOSIS — Z8719 Personal history of other diseases of the digestive system: Secondary | ICD-10-CM | POA: Diagnosis not present

## 2014-12-10 DIAGNOSIS — G8929 Other chronic pain: Secondary | ICD-10-CM | POA: Diagnosis not present

## 2014-12-10 DIAGNOSIS — Z720 Tobacco use: Secondary | ICD-10-CM | POA: Diagnosis not present

## 2014-12-10 DIAGNOSIS — S3992XA Unspecified injury of lower back, initial encounter: Secondary | ICD-10-CM | POA: Diagnosis present

## 2014-12-10 DIAGNOSIS — Z8619 Personal history of other infectious and parasitic diseases: Secondary | ICD-10-CM | POA: Diagnosis not present

## 2014-12-10 DIAGNOSIS — Y9289 Other specified places as the place of occurrence of the external cause: Secondary | ICD-10-CM | POA: Insufficient documentation

## 2014-12-10 DIAGNOSIS — M549 Dorsalgia, unspecified: Secondary | ICD-10-CM

## 2014-12-10 DIAGNOSIS — Z792 Long term (current) use of antibiotics: Secondary | ICD-10-CM | POA: Insufficient documentation

## 2014-12-10 DIAGNOSIS — Y9389 Activity, other specified: Secondary | ICD-10-CM | POA: Insufficient documentation

## 2014-12-10 DIAGNOSIS — Z79899 Other long term (current) drug therapy: Secondary | ICD-10-CM | POA: Insufficient documentation

## 2014-12-10 DIAGNOSIS — W1839XA Other fall on same level, initial encounter: Secondary | ICD-10-CM | POA: Diagnosis not present

## 2014-12-10 DIAGNOSIS — F329 Major depressive disorder, single episode, unspecified: Secondary | ICD-10-CM | POA: Insufficient documentation

## 2014-12-10 DIAGNOSIS — Y998 Other external cause status: Secondary | ICD-10-CM | POA: Diagnosis not present

## 2014-12-10 MED ORDER — HYDROMORPHONE HCL 1 MG/ML IJ SOLN
2.0000 mg | Freq: Once | INTRAMUSCULAR | Status: AC
  Administered 2014-12-10: 2 mg via INTRAMUSCULAR
  Filled 2014-12-10: qty 2

## 2014-12-10 NOTE — Discharge Instructions (Signed)
Back Injury Prevention Back injuries can be extremely painful and difficult to heal. After having one back injury, you are much more likely to experience another later on. It is important to learn how to avoid injuring or re-injuring your back. The following tips can help you to prevent a back injury. PHYSICAL FITNESS  Exercise regularly and try to develop good tone in your abdominal muscles. Your abdominal muscles provide a lot of the support needed by your back.  Do aerobic exercises (walking, jogging, biking, swimming) regularly.  Do exercises that increase balance and strength (tai chi, yoga) regularly. This can decrease your risk of falling and injuring your back.  Stretch before and after exercising.  Maintain a healthy weight. The more you weigh, the more stress is placed on your back. For every pound of weight, 10 times that amount of pressure is placed on the back. DIET  Talk to your caregiver about how much calcium and vitamin D you need per day. These nutrients help to prevent weakening of the bones (osteoporosis). Osteoporosis can cause broken (fractured) bones that lead to back pain.  Include good sources of calcium in your diet, such as dairy products, green, leafy vegetables, and products with calcium added (fortified).  Include good sources of vitamin D in your diet, such as milk and foods that are fortified with vitamin D.  Consider taking a nutritional supplement or a multivitamin if needed.  Stop smoking if you smoke. POSTURE  Sit and stand up straight. Avoid leaning forward when you sit or hunching over when you stand.  Choose chairs with good low back (lumbar) support.  If you work at a desk, sit close to your work so you do not need to lean over. Keep your chin tucked in. Keep your neck drawn back and elbows bent at a right angle. Your arms should look like the letter "L."  Sit high and close to the steering wheel when you drive. Add a lumbar support to your car  seat if needed.  Avoid sitting or standing in one position for too long. Take breaks to get up, stretch, and walk around at least once every hour. Take breaks if you are driving for long periods of time.  Sleep on your side with your knees slightly bent, or sleep on your back with a pillow under your knees. Do not sleep on your stomach. LIFTING, TWISTING, AND REACHING  Avoid heavy lifting, especially repetitive lifting. If you must do heavy lifting:  Stretch before lifting.  Work slowly.  Rest between lifts.  Use carts and dollies to move objects when possible.  Make several small trips instead of carrying 1 heavy load.  Ask for help when you need it.  Ask for help when moving big, awkward objects.  Follow these steps when lifting:  Stand with your feet shoulder-width apart.  Get as close to the object as you can. Do not try to pick up heavy objects that are far from your body.  Use handles or lifting straps if they are available.  Bend at your knees. Squat down, but keep your heels off the floor.  Keep your shoulders pulled back, your chin tucked in, and your back straight.  Lift the object slowly, tightening the muscles in your legs, abdomen, and buttocks. Keep the object as close to the center of your body as possible.  When you put a load down, use these same guidelines in reverse.  Do not:  Lift the object above your waist.  Twist at the waist while lifting or carrying a load. Move your feet if you need to turn, not your waist.  Bend over without bending at your knees.  Avoid reaching over your head, across a table, or for an object on a high surface. OTHER TIPS  Avoid wet floors and keep sidewalks clear of ice to prevent falls.  Do not sleep on a mattress that is too soft or too hard.  Keep items that are used frequently within easy reach.  Put heavier objects on shelves at waist level and lighter objects on lower or higher shelves.  Find ways to  decrease your stress, such as exercise, massage, or relaxation techniques. Stress can build up in your muscles. Tense muscles are more vulnerable to injury.  Seek treatment for depression or anxiety if needed. These conditions can increase your risk of developing back pain. SEEK MEDICAL CARE IF:  You injure your back.  You have questions about diet, exercise, or other ways to prevent back injuries. MAKE SURE YOU:  Understand these instructions.  Will watch your condition.  Will get help right away if you are not doing well or get worse. Document Released: 09/07/2004 Document Revised: 10/23/2011 Document Reviewed: 09/11/2011 Exit[employer] Patient Information 2015 [employer]. This information is not intended to replace advice given to you by your health care provider. Make sure you discuss any questions you have with your health care provider.  

## 2014-12-10 NOTE — ED Notes (Signed)
Pt. Walked to room 11 with no difficulty.

## 2014-12-10 NOTE — ED Notes (Signed)
Correction to Discharge Entry- This pt was not given prescriptions on dc

## 2014-12-10 NOTE — ED Provider Notes (Signed)
CSN: [id number]     Arrival date & time 12/10/14  1523 History   First MD Initiated Contact with Patient 12/10/14 1553     Chief Complaint  Patient presents with  . Back Pain      HPI Pt amb to triage with quick steady gait in nad. Pt reports was lifting and moving large rocks last Wednesday, and fell backwards landing on dirt on his left hip. Has lbp on left side radiating down left hip. Denies head injury, loc or any other c/o. Past Medical History  Diagnosis Date  . History of chicken pox   . Depression   . GERD (gastroesophageal reflux disease)   . Chronic back pain    Past Surgical History  Procedure Laterality Date  . Nose surgery     Family History  Problem Relation Age of Onset  . Arthritis Father   . Hypertension Father    History  Substance Use Topics  . Smoking status: Current Every Day Smoker    Types: Cigars  . Smokeless tobacco: Never Used  . Alcohol Use: Yes     Comment:  1 beer daily    Review of Systems  All other systems reviewed and are negative  Allergies  Review of patient's allergies indicates no known allergies.  Home Medications   Prior to Admission medications   Medication Sig Start Date End Date Taking? Authorizing Provider  amoxicillin (AMOXIL) 500 MG capsule Take 1 capsule (500 mg total) by mouth 3 (three) times daily. 04/08/14   Robyn M Hess, PA-C  diazepam (VALIUM) 10 MG tablet Take 10 mg by mouth every 8 (eight) hours as needed. 10/29/12   Historical Provider, MD  FLUoxetine (PROZAC) 40 MG capsule Take 1 capsule (40 mg total) by mouth daily. 11/01/12   Sandford CrazeMelissa O'Sullivan, NP  PARoxetine (PAXIL) 40 MG tablet TAKE 1 TABLET BY MOUTH EVERY MORNING. 10/09/12   Sandford CrazeMelissa O'Sullivan, NP  traMADol (ULTRAM) 50 MG tablet Take 1 tablet (50 mg total) by mouth every 6 (six) hours as needed. 04/08/14   Robyn M Hess, PA-C   BP 124/85 mmHg  Pulse 65  Temp(Src) 98.4 F (36.9 C) (Oral)  Resp 18  SpO2 99% Physical Exam  Constitutional: He is oriented  to person, place, and time. He appears well-developed and well-nourished. No distress.  HENT:  Head: Normocephalic and atraumatic.  Eyes: Pupils are equal, round, and reactive to light.  Neck: Normal range of motion.  Cardiovascular: Normal rate and intact distal pulses.   Pulmonary/Chest: No respiratory distress.  Abdominal: Normal appearance. He exhibits no distension. There is no tenderness.  Musculoskeletal: Normal range of motion.       Back:  Neurological: He is alert and oriented to person, place, and time. No cranial nerve deficit.  Skin: Skin is warm and dry. No rash noted.  Psychiatric: He has a normal mood and affect. His behavior is normal.  Nursing note and vitals reviewed.   ED Course  Procedures (including critical care time) Labs Review Labs Reviewed - No data to display  Imaging Review Dg Lumbar Spine Complete  12/10/2014   CLINICAL DATA:  Fall 8 days ago.  Back pain  EXAM: LUMBAR SPINE - COMPLETE 4+ VIEW  COMPARISON:  Lumbar MRI 12/14/2012  FINDINGS: Chronic compression fracture T12 is unchanged from the prior study. Negative for acute fracture  Normal alignment.  Disc spaces are maintained.  No pars defect.  IMPRESSION: Chronic fracture T12.  No acute abnormality.   Electronically Signed  By: Marlan Palau M.D.   On: 12/10/2014 16:40     Review of [employer] reveals numerous and frequent recently filled prescriptions for narcotics.  At this time plan on treating in the emergency department but no prescription will be given.  Will last the patient to get his narcotics filled by his primary care physician. MDM   Final diagnoses:  Chronic back pain        Nelva Nay, MD 12/11/14 1243

## 2014-12-10 NOTE — ED Notes (Signed)
Pt amb to triage with quick steady gait in nad. Pt reports was lifting and moving large rocks last Wednesday, and fell backwards landing on dirt on his left hip. Has lbp on left side radiating down left hip. Denies head injury, loc or any other c/o.

## 2014-12-14 ENCOUNTER — Encounter: Payer: Self-pay | Admitting: Physician Assistant

## 2014-12-14 ENCOUNTER — Ambulatory Visit (INDEPENDENT_AMBULATORY_CARE_PROVIDER_SITE_OTHER): Payer: Self-pay | Admitting: Physician Assistant

## 2014-12-14 VITALS — BP 140/80 | HR 80 | Temp 98.0°F | Wt 220.0 lb

## 2014-12-14 DIAGNOSIS — F329 Major depressive disorder, single episode, unspecified: Secondary | ICD-10-CM

## 2014-12-14 DIAGNOSIS — M5430 Sciatica, unspecified side: Secondary | ICD-10-CM

## 2014-12-14 DIAGNOSIS — F32A Depression, unspecified: Secondary | ICD-10-CM

## 2014-12-14 MED ORDER — METHYLPREDNISOLONE ACETATE 80 MG/ML IJ SUSP
80.0000 mg | Freq: Once | INTRAMUSCULAR | Status: AC
  Administered 2014-12-14: 80 mg via INTRAMUSCULAR

## 2014-12-14 MED ORDER — METHYLPREDNISOLONE 4 MG PO TBPK: ORAL_TABLET | ORAL | Status: DC

## 2014-12-14 MED ORDER — CYCLOBENZAPRINE HCL 10 MG PO TABS: 10.0000 mg | ORAL_TABLET | Freq: Three times a day (TID) | ORAL | Status: DC | PRN

## 2014-12-14 NOTE — Assessment & Plan Note (Signed)
Patient refuses counseling or SSRI therapy.  Is requesting Xanax.  Advised him this medication does not treat depressed mood and is habit forming.  No anxiety present.  Patient given handout on depression and counseling services.  Encouraged him to reconsider SSRI therapy.

## 2014-12-14 NOTE — Progress Notes (Signed)
Pre visit review using our clinic review tool, if applicable. No additional management support is needed unless otherwise documented below in the visit note. 

## 2014-12-14 NOTE — Assessment & Plan Note (Signed)
W history of chronic back pain.  DEA database reviewed showing multiple different narcotic medications [facility].  The last Rx within past month.  Patient does endorses currently being on probation after committing a felony. Will not give him narcotic medications.80 mg IM depomedrol given to patient by nursing.  Will start medrol pack and flexeril tomorrow.  Will refer to new specialist per patient's request.

## 2014-12-14 NOTE — Progress Notes (Signed)
   Patient presents to clinic today c/o depressed mood over the past couple of years.  Is mostly stemming from not having a job and having a hard time with finances.  Denies suicidal thought or ideation.  Was previously on Paxil with side effects.  Endorses good support system at home.    Also endorses low back pain over the past week, mainly R sided.  Also endorses some left leg numbness after the incident.  Was previously followed by [facility] but states he needs referral to go back.    Past Medical History  Diagnosis Date  . History of chicken pox   . Depression   . GERD (gastroesophageal reflux disease)   . Chronic back pain     No current outpatient prescriptions on file prior to visit.   No current facility-administered medications on file prior to visit.    No Known Allergies  Family History  Problem Relation Age of Onset  . Arthritis Father   . Hypertension Father     History   Social History  . Marital Status: Married    Spouse Name: N/A  . Number of Children: 2  . Years of Education: N/A   Occupational History  .  [employer]   Social History Main Topics  . Smoking status: Current Every Day Smoker    Types: Cigars  . Smokeless tobacco: Never Used  . Alcohol Use: Yes     Comment:  1 beer daily  . Drug Use: No  . Sexual Activity: Not on file   Other Topics Concern  . 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  All other ROS are negative.  BP 140/80 mmHg  Pulse 80  Temp(Src) 98 F (36.7 C)  Wt 220 lb (99.791 kg)  SpO2 99%  Physical Exam  Constitutional: He is oriented to person, place, and time and well-developed, well-nourished, and in no distress.  Cardiovascular: Normal rate, regular rhythm, normal heart sounds and intact distal pulses.   Pulmonary/Chest: Effort normal and  breath sounds normal. No respiratory distress. He has no wheezes. He has no rales. He exhibits no tenderness.  Musculoskeletal: Normal range of motion.  Neurological: He is alert and oriented to person, place, and time.  Skin: Skin is warm and dry. No rash noted.  Psychiatric: Affect normal.  Vitals reviewed.  Assessment/Plan: Depression Patient refuses counseling or SSRI therapy.  Is requesting Xanax.  Advised him this medication does not treat depressed mood and is habit forming.  No anxiety present.  Patient given handout on depression and counseling services.  Encouraged him to reconsider SSRI therapy.   Sciatica W history of chronic back pain.  DEA database reviewed showing multiple different narcotic medications [facility].  The last Rx within past month.  Patient does endorses currently being on probation after committing a felony. Will not give him narcotic medications.80 mg IM depomedrol given to patient by nursing.  Will start medrol pack and flexeril tomorrow.  Will refer to new specialist per patient's request.

## 2014-12-14 NOTE — Patient Instructions (Signed)
Please start taking the medrol tomorrow as directed. Take Flexeril three times daily as needed for muscle spasm -- do not take while driving. Avoid heavy lifting. Follow exercises below.  I will set you up with a new specialist.  Sciatica with Rehab The sciatic nerve runs from the back down the leg and is responsible for sensation and control of the muscles in the back (posterior) side of the thigh, lower leg, and foot. Sciatica is a condition that is characterized by inflammation of this nerve.  SYMPTOMS   Signs of nerve damage, including numbness and/or weakness along the posterior side of the lower extremity.  Pain in the back of the thigh that may also travel down the leg.  Pain that worsens when sitting for long periods of time.  Occasionally, pain in the back or buttock. CAUSES  Inflammation of the sciatic nerve is the cause of sciatica. The inflammation is due to something irritating the nerve. Common sources of irritation include:  Sitting for long periods of time.  Direct trauma to the nerve.  Arthritis of the spine.  Herniated or ruptured disk.  Slipping of the vertebrae (spondylolisthesis).  Pressure from soft tissues, such as muscles or ligament-like tissue (fascia). RISK INCREASES WITH:  Sports that place pressure or stress on the spine (football or weightlifting).  Poor strength and flexibility.  Failure to warm up properly before activity.  Family history of low back pain or disk disorders.  Previous back injury or surgery.  Poor body mechanics, especially when lifting, or poor posture. PREVENTION   Warm up and stretch properly before activity.  Maintain physical fitness:  Strength, flexibility, and endurance.  Cardiovascular fitness.  Learn and use proper technique, especially with posture and lifting. When possible, have coach correct improper technique.  Avoid activities that place stress on the spine. PROGNOSIS If treated properly, then  sciatica usually resolves within 6 weeks. However, occasionally surgery is necessary.  RELATED COMPLICATIONS   Permanent nerve damage, including pain, numbness, tingle, or weakness.  Chronic back pain.  Risks of surgery: infection, bleeding, nerve damage, or damage to surrounding tissues. TREATMENT Treatment initially involves resting from any activities that aggravate your symptoms. The use of ice and medication may help reduce pain and inflammation. The use of strengthening and stretching exercises may help reduce pain with activity. These exercises may be performed at home or with referral to a therapist. A therapist may recommend further treatments, such as transcutaneous electronic nerve stimulation (TENS) or ultrasound. Your caregiver may recommend corticosteroid injections to help reduce inflammation of the sciatic nerve. If symptoms persist despite non-surgical (conservative) treatment, then surgery may be recommended. MEDICATION  If pain medication is necessary, then nonsteroidal anti-inflammatory medications, such as aspirin and ibuprofen, or other minor pain relievers, such as acetaminophen, are often recommended.  Do not take pain medication for 7 days before surgery.  Prescription pain relievers may be given if deemed necessary by your caregiver. Use only as directed and only as much as you need.  Ointments applied to the skin may be helpful.  Corticosteroid injections may be given by your caregiver. These injections should be reserved for the most serious cases, because they may only be given a certain number of times. HEAT AND COLD  Cold treatment (icing) relieves pain and reduces inflammation. Cold treatment should be applied for 10 to 15 minutes every 2 to 3 hours for inflammation and pain and immediately after any activity that aggravates your symptoms. Use ice packs or massage the area  with a piece of ice (ice massage).  Heat treatment may be used prior to performing the  stretching and strengthening activities prescribed by your caregiver, physical therapist, or athletic trainer. Use a heat pack or soak the injury in warm water. SEEK MEDICAL CARE IF:  Treatment seems to offer no benefit, or the condition worsens.  Any medications produce adverse side effects. EXERCISES  RANGE OF MOTION (ROM) AND STRETCHING EXERCISES - Sciatica Most people with sciatic will find that their symptoms worsen with either excessive bending forward (flexion) or arching at the low back (extension). The exercises which will help resolve your symptoms will focus on the opposite motion. Your physician, physical therapist or athletic trainer will help you determine which exercises will be most helpful to resolve your low back pain. Do not complete any exercises without first consulting with your clinician. Discontinue any exercises which worsen your symptoms until you speak to your clinician. If you have pain, numbness or tingling which travels down into your buttocks, leg or foot, the goal of the therapy is for these symptoms to move closer to your back and eventually resolve. Occasionally, these leg symptoms will get better, but your low back pain may worsen; this is typically an indication of progress in your rehabilitation. Be certain to be very alert to any changes in your symptoms and the activities in which you participated in the 24 hours prior to the change. Sharing this information with your clinician will allow him/her to most efficiently treat your condition. These exercises may help you when beginning to rehabilitate your injury. Your symptoms may resolve with or without further involvement from your physician, physical therapist or athletic trainer. While completing these exercises, remember:   Restoring tissue flexibility helps normal motion to return to the joints. This allows healthier, less painful movement and activity.  An effective stretch should be held for at least 30  seconds.  A stretch should never be painful. You should only feel a gentle lengthening or release in the stretched tissue. FLEXION RANGE OF MOTION AND STRETCHING EXERCISES: STRETCH - Flexion, Single Knee to Chest   Lie on a firm bed or floor with both legs extended in front of you.  Keeping one leg in contact with the floor, bring your opposite knee to your chest. Hold your leg in place by either grabbing behind your thigh or at your knee.  Pull until you feel a gentle stretch in your low back. Hold __________ seconds.  Slowly release your grasp and repeat the exercise with the opposite side. Repeat __________ times. Complete this exercise __________ times per day.  STRETCH - Flexion, Double Knee to Chest  Lie on a firm bed or floor with both legs extended in front of you.  Keeping one leg in contact with the floor, bring your opposite knee to your chest.  Tense your stomach muscles to support your back and then lift your other knee to your chest. Hold your legs in place by either grabbing behind your thighs or at your knees.  Pull both knees toward your chest until you feel a gentle stretch in your low back. Hold __________ seconds.  Tense your stomach muscles and slowly return one leg at a time to the floor. Repeat __________ times. Complete this exercise __________ times per day.  STRETCH - Low Trunk Rotation   Lie on a firm bed or floor. Keeping your legs in front of you, bend your knees so they are both pointed toward the ceiling and your  feet are flat on the floor.  Extend your arms out to the side. This will stabilize your upper body by keeping your shoulders in contact with the floor.  Gently and slowly drop both knees together to one side until you feel a gentle stretch in your low back. Hold for __________ seconds.  Tense your stomach muscles to support your low back as you bring your knees back to the starting position. Repeat the exercise to the other side. Repeat  __________ times. Complete this exercise __________ times per day  EXTENSION RANGE OF MOTION AND FLEXIBILITY EXERCISES: STRETCH - Extension, Prone on Elbows  Lie on your stomach on the floor, a bed will be too soft. Place your palms about shoulder width apart and at the height of your head.  Place your elbows under your shoulders. If this is too painful, stack pillows under your chest.  Allow your body to relax so that your hips drop lower and make contact more completely with the floor.  Hold this position for __________ seconds.  Slowly return to lying flat on the floor. Repeat __________ times. Complete this exercise __________ times per day.  RANGE OF MOTION - Extension, Prone Press Ups  Lie on your stomach on the floor, a bed will be too soft. Place your palms about shoulder width apart and at the height of your head.  Keeping your back as relaxed as possible, slowly straighten your elbows while keeping your hips on the floor. You may adjust the placement of your hands to maximize your comfort. As you gain motion, your hands will come more underneath your shoulders.  Hold this position __________ seconds.  Slowly return to lying flat on the floor. Repeat __________ times. Complete this exercise __________ times per day.  STRENGTHENING EXERCISES - Sciatica  These exercises may help you when beginning to rehabilitate your injury. These exercises should be done near your "sweet spot." This is the neutral, low-back arch, somewhere between fully rounded and fully arched, that is your least painful position. When performed in this safe range of motion, these exercises can be used for people who have either a flexion or extension based injury. These exercises may resolve your symptoms with or without further involvement from your physician, physical therapist or athletic trainer. While completing these exercises, remember:   Muscles can gain both the endurance and the strength needed for  everyday activities through controlled exercises.  Complete these exercises as instructed by your physician, physical therapist or athletic trainer. Progress with the resistance and repetition exercises only as your caregiver advises.  You may experience muscle soreness or fatigue, but the pain or discomfort you are trying to eliminate should never worsen during these exercises. If this pain does worsen, stop and make certain you are following the directions exactly. If the pain is still present after adjustments, discontinue the exercise until you can discuss the trouble with your clinician. STRENGTHENING - Deep Abdominals, Pelvic Tilt   Lie on a firm bed or floor. Keeping your legs in front of you, bend your knees so they are both pointed toward the ceiling and your feet are flat on the floor.  Tense your lower abdominal muscles to press your low back into the floor. This motion will rotate your pelvis so that your tail bone is scooping upwards rather than pointing at your feet or into the floor.  With a gentle tension and even breathing, hold this position for __________ seconds. Repeat __________ times. Complete this exercise __________ times  per day.  STRENGTHENING - Abdominals, Crunches   Lie on a firm bed or floor. Keeping your legs in front of you, bend your knees so they are both pointed toward the ceiling and your feet are flat on the floor. Cross your arms over your chest.  Slightly tip your chin down without bending your neck.  Tense your abdominals and slowly lift your trunk high enough to just clear your shoulder blades. Lifting higher can put excessive stress on the low back and does not further strengthen your abdominal muscles.  Control your return to the starting position. Repeat __________ times. Complete this exercise __________ times per day.  STRENGTHENING - Quadruped, Opposite UE/LE Lift  Assume a hands and knees position on a firm surface. Keep your hands under your  shoulders and your knees under your hips. You may place padding under your knees for comfort.  Find your neutral spine and gently tense your abdominal muscles so that you can maintain this position. Your shoulders and hips should form a rectangle that is parallel with the floor and is not twisted.  Keeping your trunk steady, lift your right hand no higher than your shoulder and then your left leg no higher than your hip. Make sure you are not holding your breath. Hold this position __________ seconds.  Continuing to keep your abdominal muscles tense and your back steady, slowly return to your starting position. Repeat with the opposite arm and leg. Repeat __________ times. Complete this exercise __________ times per day.  STRENGTHENING - Abdominals and Quadriceps, Straight Leg Raise   Lie on a firm bed or floor with both legs extended in front of you.  Keeping one leg in contact with the floor, bend the other knee so that your foot can rest flat on the floor.  Find your neutral spine, and tense your abdominal muscles to maintain your spinal position throughout the exercise.  Slowly lift your straight leg off the floor about 6 inches for a count of 15, making sure to not hold your breath.  Still keeping your neutral spine, slowly lower your leg all the way to the floor. Repeat this exercise with each leg __________ times. Complete this exercise __________ times per day. POSTURE AND BODY MECHANICS CONSIDERATIONS - Sciatica Keeping correct posture when sitting, standing or completing your activities will reduce the stress put on different body tissues, allowing injured tissues a chance to heal and limiting painful experiences. The following are general guidelines for improved posture. Your physician or physical therapist will provide you with any instructions specific to your needs. While reading these guidelines, remember:  The exercises prescribed by your provider will help you have the  flexibility and strength to maintain correct postures.  The correct posture provides the optimal environment for your joints to work. All of your joints have less wear and tear when properly supported by a spine with good posture. This means you will experience a healthier, less painful body.  Correct posture must be practiced with all of your activities, especially prolonged sitting and standing. Correct posture is as important when doing repetitive low-stress activities (typing) as it is when doing a single heavy-load activity (lifting). RESTING POSITIONS Consider which positions are most painful for you when choosing a resting position. If you have pain with flexion-based activities (sitting, bending, stooping, squatting), choose a position that allows you to rest in a less flexed posture. You would want to avoid curling into a fetal position on your side. If your pain worsens  with extension-based activities (prolonged standing, working overhead), avoid resting in an extended position such as sleeping on your stomach. Most people will find more comfort when they rest with their spine in a more neutral position, neither too rounded nor too arched. Lying on a non-sagging bed on your side with a pillow between your knees, or on your back with a pillow under your knees will often provide some relief. Keep in mind, being in any one position for a prolonged period of time, no matter how correct your posture, can still lead to stiffness. PROPER SITTING POSTURE In order to minimize stress and discomfort on your spine, you must sit with correct posture Sitting with good posture should be effortless for a healthy body. Returning to good posture is a gradual process. Many people can work toward this most comfortably by using various supports until they have the flexibility and strength to maintain this posture on their own. When sitting with proper posture, your ears will fall over your shoulders and your shoulders  will fall over your hips. You should use the back of the chair to support your upper back. Your low back will be in a neutral position, just slightly arched. You may place a small pillow or folded towel at the base of your low back for support.  When working at a desk, create an environment that supports good, upright posture. Without extra support, muscles fatigue and lead to excessive strain on joints and other tissues. Keep these recommendations in mind: CHAIR:   A chair should be able to slide under your desk when your back makes contact with the back of the chair. This allows you to work closely.  The chair's height should allow your eyes to be level with the upper part of your monitor and your hands to be slightly lower than your elbows. BODY POSITION  Your feet should make contact with the floor. If this is not possible, use a foot rest.  Keep your ears over your shoulders. This will reduce stress on your neck and low back. INCORRECT SITTING POSTURES   If you are feeling tired and unable to assume a healthy sitting posture, do not slouch or slump. This puts excessive strain on your back tissues, causing more damage and pain. Healthier options include:  Using more support, like a lumbar pillow.  Switching tasks to something that requires you to be upright or walking.  Talking a brief walk.  Lying down to rest in a neutral-spine position. PROLONGED STANDING WHILE SLIGHTLY LEANING FORWARD  When completing a task that requires you to lean forward while standing in one place for a long time, place either foot up on a stationary 2-4 inch high object to help maintain the best posture. When both feet are on the ground, the low back tends to lose its slight inward curve. If this curve flattens (or becomes too large), then the back and your other joints will experience too much stress, fatigue more quickly and can cause pain.  CORRECT STANDING POSTURES Proper standing posture should be assumed  with all daily activities, even if they only take a few moments, like when brushing your teeth. As in sitting, your ears should fall over your shoulders and your shoulders should fall over your hips. You should keep a slight tension in your abdominal muscles to brace your spine. Your tailbone should point down to the ground, not behind your body, resulting in an over-extended swayback posture.  INCORRECT STANDING POSTURES  Common incorrect standing  postures include a forward head, locked knees and/or an excessive swayback. WALKING Walk with an upright posture. Your ears, shoulders and hips should all line-up. PROLONGED ACTIVITY IN A FLEXED POSITION When completing a task that requires you to bend forward at your waist or lean over a low surface, try to find a way to stabilize 3 of 4 of your limbs. You can place a hand or elbow on your thigh or rest a knee on the surface you are reaching across. This will provide you more stability so that your muscles do not fatigue as quickly. By keeping your knees relaxed, or slightly bent, you will also reduce stress across your low back. CORRECT LIFTING TECHNIQUES DO :   Assume a wide stance. This will provide you more stability and the opportunity to get as close as possible to the object which you are lifting.  Tense your abdominals to brace your spine; then bend at the knees and hips. Keeping your back locked in a neutral-spine position, lift using your leg muscles. Lift with your legs, keeping your back straight.  Test the weight of unknown objects before attempting to lift them.  Try to keep your elbows locked down at your sides in order get the best strength from your shoulders when carrying an object.  Always ask for help when lifting heavy or awkward objects. INCORRECT LIFTING TECHNIQUES DO NOT:   Lock your knees when lifting, even if it is a small object.  Bend and twist. Pivot at your feet or move your feet when needing to change  directions.  Assume that you cannot safely pick up a paperclip without proper posture. Document Released: 07/31/2005 Document Revised: 12/15/2013 Document Reviewed: 11/12/2008 [facility] Patient Information 2015 [employer], [place]. This information is not intended to replace advice given to you by your health care provider. Make sure you discuss any questions you have with your health care provider.

## 2014-12-16 ENCOUNTER — Telehealth: Payer: Self-pay | Admitting: Physician Assistant

## 2014-12-16 DIAGNOSIS — M5416 Radiculopathy, lumbar region: Secondary | ICD-10-CM

## 2014-12-16 NOTE — Telephone Encounter (Signed)
Informed patient of this.  °

## 2014-12-16 NOTE — Telephone Encounter (Signed)
Referral in system.  Will take a few days to hear from them -- depends how many referrals they have in queue.

## 2014-12-16 NOTE — Telephone Encounter (Signed)
Caller name: Molly MaduroRobert Relation to WU:JWJXpt:self Call back number: [phone] Pharmacy:  Reason for call:   Patient thought that Selena BattenCody was going to refer him to orthopaedics? I do not see referral.

## 2015-01-06 ENCOUNTER — Ambulatory Visit (INDEPENDENT_AMBULATORY_CARE_PROVIDER_SITE_OTHER): Payer: Self-pay | Admitting: Physician Assistant

## 2015-01-06 ENCOUNTER — Encounter: Payer: Self-pay | Admitting: Physician Assistant

## 2015-01-06 ENCOUNTER — Telehealth: Payer: Self-pay | Admitting: Family

## 2015-01-06 VITALS — BP 132/88 | HR 78 | Temp 98.4°F | Ht 78.5 in

## 2015-01-06 DIAGNOSIS — S91311D Laceration without foreign body, right foot, subsequent encounter: Secondary | ICD-10-CM

## 2015-01-06 DIAGNOSIS — S91319A Laceration without foreign body, unspecified foot, initial encounter: Secondary | ICD-10-CM | POA: Insufficient documentation

## 2015-01-06 MED ORDER — KETOROLAC TROMETHAMINE 60 MG/2ML IM SOLN
60.0000 mg | Freq: Once | INTRAMUSCULAR | Status: AC
  Administered 2015-01-06: 60 mg via INTRAMUSCULAR

## 2015-01-06 MED ORDER — TRAMADOL HCL 50 MG PO TABS: 50.0000 mg | ORAL_TABLET | Freq: Two times a day (BID) | ORAL | Status: DC | PRN

## 2015-01-06 NOTE — Assessment & Plan Note (Signed)
Sutures in place. No evidence of active infection. Im toradol given. Will allow one-time Rx for Tramadol to help with pain.  No refills will be given due to history of drug seeking behavior. Wound care discussed with patient.  Continue Keflex as directed.  Follow-up in 7 days for suture removal.

## 2015-01-06 NOTE — Patient Instructions (Signed)
Please keep area clean and dry. Keep bandaged if having to ambulate. Take antibiotic as directed.  Use Tramadol sparingly for pain -- this is a one-time prescription.  Follow-up next Wednesday for stitch removal.

## 2015-01-06 NOTE — Addendum Note (Signed)
Addended by: Verdie ShireBAYNES, ANGELA M on: 01/06/2015 04:23 PM   Modules accepted: Orders

## 2015-01-06 NOTE — Progress Notes (Signed)
Pre visit review using our clinic review tool, if applicable. No additional management support is needed unless otherwise documented below in the visit note. 

## 2015-01-06 NOTE — Progress Notes (Signed)
    Patient presents to clinic today c/o painful cut on bottom of R foot. Patient endorses cutting fut on a shard of glass yesterday afternoon while walking barefoot in the woods. [place] [facility] [place] was [facility].  Patient instructed to follow-up with PCP regarding pain management.  Patient also given Rx Keflex in case of infection.  Has been taking as directed. Endorses pain 8/10 presently.  Denies drainage from wound.  Denies fever, chills, malaise or fatigue.  Past Medical History  Diagnosis Date  . History of chicken pox   . Depression   . GERD (gastroesophageal reflux disease)   . Chronic back pain     No current outpatient prescriptions on file prior to visit.   No current facility-administered medications on file prior to visit.    No Known Allergies  Family History  Problem Relation Age of Onset  . Arthritis Father   . Hypertension Father     History   Social History  . Marital Status: Married    Spouse Name: N/A  . Number of Children: 2  . Years of Education: N/A   Occupational History  .  [employer]   Social History Main Topics  . Smoking status: Current Every Day Smoker    Types: Cigars  . Smokeless tobacco: Never Used  . Alcohol Use: Yes     Comment:  1 beer daily  . Drug Use: No  . Sexual Activity: Not on file   Other Topics Concern  . 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  All other ROS are negative.  BP 132/88 mmHg  Pulse 78  Temp(Src) 98.4 F (36.9 C) (Oral)  Ht 6' 6.5" (1.994 m)  SpO2 98%  Physical Exam  Constitutional: He is oriented to person, place, and time and well-developed, well-nourished, and in no distress.  HENT:  Head: Normocephalic and atraumatic.  Cardiovascular: Normal rate, regular rhythm, normal heart sounds and intact distal  pulses.   Pulmonary/Chest: Effort normal and breath sounds normal. No respiratory distress. He has no wheezes. He has no rales. He exhibits no tenderness.  Neurological: He is alert and oriented to person, place, and time.  Skin: Skin is warm and dry.     Vitals reviewed.   No results found for this or any previous visit (from the past 2160 hour(s)).  Assessment/Plan: Laceration of foot Sutures in place. No evidence of active infection. Im toradol given. Will allow one-time Rx for Tramadol to help with pain.  No refills will be given due to history of drug seeking behavior. Wound care discussed with patient.  Continue Keflex as directed.  Follow-up in 7 days for suture removal.

## 2015-01-06 NOTE — Telephone Encounter (Signed)
Error

## 2015-01-12 DIAGNOSIS — Z0289 Encounter for other administrative examinations: Secondary | ICD-10-CM

## 2015-01-13 ENCOUNTER — Ambulatory Visit: Payer: Self-pay | Admitting: Physician Assistant

## 2015-01-13 DIAGNOSIS — Z0289 Encounter for other administrative examinations: Secondary | ICD-10-CM

## 2015-01-19 ENCOUNTER — Telehealth: Payer: Self-pay | Admitting: Physician Assistant

## 2015-01-19 ENCOUNTER — Encounter: Payer: Self-pay | Admitting: Physician Assistant

## 2015-01-19 NOTE — Telephone Encounter (Signed)
No charge. If this happens again will have to charge.

## 2015-01-19 NOTE — Telephone Encounter (Signed)
Pt was no show for appt on 01/13/15- letter sent. Charge pt? °

## 2015-01-27 ENCOUNTER — Ambulatory Visit: Payer: Self-pay | Admitting: Physician Assistant

## 2015-01-28 ENCOUNTER — Ambulatory Visit: Payer: Self-pay | Admitting: Medical

## 2015-01-29 ENCOUNTER — Encounter: Payer: Self-pay | Admitting: Physician Assistant

## 2015-01-29 ENCOUNTER — Telehealth: Payer: Self-pay | Admitting: Physician Assistant

## 2015-01-29 NOTE — Telephone Encounter (Signed)
Charge for me

## 2015-01-29 NOTE — Telephone Encounter (Signed)
Pt no show for appt 01/27/15 for stitches removal with [facility]. Rescheduled for 01/28/15 for stitch removal with Ramon Dredge and was no show again. Pt last no show 01/13/15 with [facility].   Charge for 6/15 with [place]? Charge for 6/16 with Ramon Dredge?

## 2015-01-29 NOTE — Telephone Encounter (Signed)
Charge both and he will be dismissed from practice if this happens again.

## 2015-11-18 ENCOUNTER — Emergency Department (HOSPITAL_BASED_OUTPATIENT_CLINIC_OR_DEPARTMENT_OTHER): Payer: Self-pay

## 2015-11-18 ENCOUNTER — Emergency Department (HOSPITAL_BASED_OUTPATIENT_CLINIC_OR_DEPARTMENT_OTHER)
Admission: EM | Admit: 2015-11-18 | Discharge: 2015-11-18 | Disposition: A | Discharge status: Home / Self Care | Payer: Self-pay | Attending: Emergency Medicine | Admitting: Emergency Medicine

## 2015-11-18 ENCOUNTER — Encounter (HOSPITAL_BASED_OUTPATIENT_CLINIC_OR_DEPARTMENT_OTHER): Payer: Self-pay | Admitting: *Deleted

## 2015-11-18 DIAGNOSIS — W19XXXA Unspecified fall, initial encounter: Secondary | ICD-10-CM

## 2015-11-18 DIAGNOSIS — Y999 Unspecified external cause status: Secondary | ICD-10-CM | POA: Insufficient documentation

## 2015-11-18 DIAGNOSIS — F329 Major depressive disorder, single episode, unspecified: Secondary | ICD-10-CM | POA: Insufficient documentation

## 2015-11-18 DIAGNOSIS — W1830XA Fall on same level, unspecified, initial encounter: Secondary | ICD-10-CM | POA: Insufficient documentation

## 2015-11-18 DIAGNOSIS — M25552 Pain in left hip: Secondary | ICD-10-CM | POA: Insufficient documentation

## 2015-11-18 DIAGNOSIS — Y939 Activity, unspecified: Secondary | ICD-10-CM | POA: Insufficient documentation

## 2015-11-18 DIAGNOSIS — Y929 Unspecified place or not applicable: Secondary | ICD-10-CM | POA: Insufficient documentation

## 2015-11-18 MED ORDER — HYDROCODONE-ACETAMINOPHEN 5-325 MG PO TABS
1.0000 | ORAL_TABLET | Freq: Once | ORAL | Status: AC
  Administered 2015-11-18: 1 via ORAL
  Filled 2015-11-18: qty 1

## 2015-11-18 MED ORDER — MELOXICAM 7.5 MG PO TABS: 7.5000 mg | ORAL_TABLET | Freq: Every day | ORAL | Status: DC

## 2015-11-18 MED ORDER — NAPROXEN 250 MG PO TABS
500.0000 mg | ORAL_TABLET | Freq: Once | ORAL | Status: AC
  Administered 2015-11-18: 500 mg via ORAL
  Filled 2015-11-18: qty 2

## 2015-11-18 NOTE — ED Provider Notes (Signed)
CSN: [id number]     Arrival date & time 11/18/15  1250 History   First MD Initiated Contact with Patient 11/18/15 1303     Chief Complaint  Patient presents with  . Hip Pain     (Consider location/radiation/quality/duration/timing/severity/associated sxs/prior Treatment) Patient is a 50 y.o. male presenting with hip pain. The history is provided by the patient.  Hip Pain Associated symptoms include arthralgias and myalgias. Pertinent negatives include no abdominal pain, joint swelling, numbness or weakness.   Billy White is a 50 y.o. male with PMH significant for depression, GERD, chronic back pain who presents with sudden onset, constant, worsening, non-radiating left hip pain after falling on bricks 3 weeks ago. Patient reports taking ibuprofen with minimal relief. He is ambulatory with a limp.  Past Medical History  Diagnosis Date  . History of chicken pox   . Depression   . GERD (gastroesophageal reflux disease)   . Chronic back pain    Past Surgical History  Procedure Laterality Date  . Nose surgery    . Knee surgery     Family History  Problem Relation Age of Onset  . Arthritis Father   . Hypertension Father    Social History  Substance Use Topics  . Smoking status: Current Every Day Smoker    Types: Cigars  . Smokeless tobacco: Never Used  . Alcohol Use: Yes     Comment:  1 beer daily    Review of Systems  Gastrointestinal: Negative for abdominal pain.  Musculoskeletal: Positive for myalgias and arthralgias. Negative for joint swelling.  Skin: Negative for color change and wound.  Neurological: Negative for weakness and numbness.  All other systems reviewed and are negative.     Allergies  Review of patient's allergies indicates no known allergies.  Home Medications   Prior to Admission medications   Medication Sig Start Date End Date Taking? Authorizing Provider  traMADol (ULTRAM) 50 MG tablet Take 1 tablet (50 mg total) by mouth every 12  (twelve) hours as needed. 01/06/15   Waldon Merl, PA-C   BP 121/84 mmHg  Pulse 60  Temp(Src) 97.8 F (36.6 C) (Oral)  Resp 18  Ht  (2.007 m)  Wt 99.791 kg  BMI 24.77 kg/m2  SpO2 100% Physical Exam  Constitutional: He is oriented to person, place, and time. He appears well-developed and well-nourished.  HENT:  Head: Normocephalic and atraumatic.  Right Ear: External ear normal.  Left Ear: External ear normal.  Eyes: Conjunctivae are normal. No scleral icterus.  Neck: No tracheal deviation present.  Cardiovascular:  Pulses:      Dorsalis pedis pulses are 2+ on the left side.  Pulmonary/Chest: Effort normal. No respiratory distress.  Abdominal: He exhibits no distension.  Musculoskeletal: Normal range of motion.  Left hip tender to palpation over greater trochanter. No ecchymoses, bruising, erythema, or swelling. No obvious deformities. Full active range of motion. Pain with internal and external rotation.  Neurological: He is alert and oriented to person, place, and time.  Strength and sensation intact.  Skin: Skin is warm and dry.  Psychiatric: He has a normal mood and affect. His behavior is normal.    ED Course  Procedures (including critical care time) Labs Review Labs Reviewed - No data to display  Imaging Review Ct Hip Left Wo Contrast  11/18/2015  CLINICAL DATA:  Left hip pain.  Pain near the left buttock. EXAM: CT OF THE LEFT HIP WITHOUT CONTRAST TECHNIQUE: Multidetector CT imaging of the left  hip was performed according to the standard protocol. Multiplanar CT image reconstructions were also generated. COMPARISON:  None. FINDINGS: There is no acute fracture or dislocation. The joint space is maintained. There is no lytic or sclerotic osseous lesion. There is no periosteal reaction or bone destruction. The muscles are normal. There is no fluid collection or hematoma. There is no muscle atrophy. There is a fat containing left inguinal hernia. There is no left  inguinal lymphadenopathy. IMPRESSION: No acute osseous injury of the left hip. Electronically Signed   By: Elige KoHetal  Patel   On: 11/18/2015 14:26   Dg Hip Unilat With Pelvis 2-3 Views Left  11/18/2015  CLINICAL DATA:  Fall.  Left hip pain EXAM: DG HIP (WITH OR WITHOUT PELVIS) 2-3V LEFT COMPARISON:  None FINDINGS: There is no evidence of hip fracture or dislocation. There is no evidence of arthropathy or other focal bone abnormality. IMPRESSION: Negative. Electronically Signed   By: Signa Kellaylor  Stroud M.D.   On: 11/18/2015 13:43   I have personally reviewed and evaluated these images and lab results as part of my medical decision-making.   EKG Interpretation None      MDM   Final diagnoses:  Fall, initial encounter  Left hip pain   Patient presents with left hip pain after fall 3 weeks ago. He is ambulatory with a limp. Left hip tender to palpation. Neurovascularly intact. Patient given naproxen and Norco in the ED. Plain films of left hip negative. Patient has pain with internal and external rotation of the left hip. Therefore, obtained CT of the left hip. This is negative. Patient discharged home with Mobic and orthopedic follow-up. Discussed return precautions. Patient agrees and acknowledges the above plan for discharge.  Case has been discussed with and seen by Dr. Ethelda ChickJacubowitz who agrees with the above plan for discharge.     Billy FowlerKayla Anissa Abbs, PA-C 11/18/15 1549  Doug SouSam Jacubowitz, MD 11/18/15 [phone]

## 2015-11-18 NOTE — Discharge Instructions (Signed)
X-rays and CTs scan of your left hip are normal today. You may take Mobic once a day for ear pain. Please follow-up with Dr. Pearletha ForgeHudnall if you experience ongoing hip pain.  Hip Pain Your hip is the joint between your upper legs and your lower pelvis. The bones, cartilage, tendons, and muscles of your hip joint perform a lot of work each day supporting your body weight and allowing you to move around. Hip pain can range from a minor ache to severe pain in one or both of your hips. Pain may be felt on the inside of the hip joint near the groin, or the outside near the buttocks and upper thigh. You may have swelling or stiffness as well.  HOME CARE INSTRUCTIONS   Take medicines only as directed by your health care provider.  Apply ice to the injured area:  Put ice in a plastic bag.  Place a towel between your skin and the bag.  Leave the ice on for 15-20 minutes at a time, 3-4 times a day.  Keep your leg raised (elevated) when possible to lessen swelling.  Avoid activities that cause pain.  Follow specific exercises as directed by your health care provider.  Sleep with a pillow between your legs on your most comfortable side.  Record how often you have hip pain, the location of the pain, and what it feels like. SEEK MEDICAL CARE IF:   You are unable to put weight on your leg.  Your hip is red or swollen or very tender to touch.  Your pain or swelling continues or worsens after 1 week.  You have increasing difficulty walking.  You have a fever. SEEK IMMEDIATE MEDICAL CARE IF:   You have fallen.  You have a sudden increase in pain and swelling in your hip. MAKE SURE YOU:   Understand these instructions.  Will watch your condition.  Will get help right away if you are not doing well or get worse.   This information is not intended to replace advice given to you by your health care provider. Make sure you discuss any questions you have with your health care provider.     Document Released: 01/18/2010 Document Revised: 08/21/2014 Document Reviewed: 03/27/2013 Elsevier Interactive Patient Education [employer]2016 Elsevier Inc.

## 2015-11-18 NOTE — ED Notes (Signed)
Left hip pain x 3 weeks. Pain started after a fall. [place] and was told to take Ibuprofen. He has had no relief with Ibuprofen.

## 2015-11-18 NOTE — ED Provider Notes (Signed)
Complains of left hip pain nonradiating after he fell at his home landing against breakfast 3 weeks ago. Pain is worse with walking. He's been prescribed ibuprofen without relief. Pain worse with walking improved with remaining still. No other injury. No other associated symptoms On exam no distress. Pelvis is stable. Left lower extremity no deformity tender left hip. Mild pain on internal rotation of thigh. DP pulse 2+. Good capillary refill.  Doug SouSam Renn Dirocco, MD 11/18/15 1327

## 2016-04-24 ENCOUNTER — Emergency Department (HOSPITAL_BASED_OUTPATIENT_CLINIC_OR_DEPARTMENT_OTHER): Payer: Self-pay

## 2016-04-24 ENCOUNTER — Emergency Department (HOSPITAL_BASED_OUTPATIENT_CLINIC_OR_DEPARTMENT_OTHER)
Admission: EM | Admit: 2016-04-24 | Discharge: 2016-04-24 | Disposition: A | Discharge status: Home / Self Care | Payer: Self-pay | Attending: Emergency Medicine | Admitting: Emergency Medicine

## 2016-04-24 ENCOUNTER — Emergency Department (HOSPITAL_COMMUNITY)
Admission: EM | Admit: 2016-04-24 | Discharge: 2016-04-24 | Disposition: A | Discharge status: Home / Self Care | Payer: Self-pay | Attending: Emergency Medicine | Admitting: Emergency Medicine

## 2016-04-24 ENCOUNTER — Encounter (HOSPITAL_COMMUNITY): Payer: Self-pay

## 2016-04-24 ENCOUNTER — Encounter (HOSPITAL_BASED_OUTPATIENT_CLINIC_OR_DEPARTMENT_OTHER): Payer: Self-pay | Admitting: Adult Health

## 2016-04-24 DIAGNOSIS — Y999 Unspecified external cause status: Secondary | ICD-10-CM | POA: Insufficient documentation

## 2016-04-24 DIAGNOSIS — S90111A Contusion of right great toe without damage to nail, initial encounter: Secondary | ICD-10-CM | POA: Insufficient documentation

## 2016-04-24 DIAGNOSIS — Y939 Activity, unspecified: Secondary | ICD-10-CM | POA: Insufficient documentation

## 2016-04-24 DIAGNOSIS — R0781 Pleurodynia: Secondary | ICD-10-CM

## 2016-04-24 DIAGNOSIS — Y9301 Activity, walking, marching and hiking: Secondary | ICD-10-CM | POA: Insufficient documentation

## 2016-04-24 DIAGNOSIS — W010XXD Fall on same level from slipping, tripping and stumbling without subsequent striking against object, subsequent encounter: Secondary | ICD-10-CM | POA: Insufficient documentation

## 2016-04-24 DIAGNOSIS — Y929 Unspecified place or not applicable: Secondary | ICD-10-CM | POA: Insufficient documentation

## 2016-04-24 DIAGNOSIS — W19XXXA Unspecified fall, initial encounter: Secondary | ICD-10-CM

## 2016-04-24 DIAGNOSIS — S92401A Displaced unspecified fracture of right great toe, initial encounter for closed fracture: Secondary | ICD-10-CM

## 2016-04-24 DIAGNOSIS — W19XXXD Unspecified fall, subsequent encounter: Secondary | ICD-10-CM

## 2016-04-24 DIAGNOSIS — S2239XA Fracture of one rib, unspecified side, initial encounter for closed fracture: Secondary | ICD-10-CM | POA: Insufficient documentation

## 2016-04-24 DIAGNOSIS — W01198A Fall on same level from slipping, tripping and stumbling with subsequent striking against other object, initial encounter: Secondary | ICD-10-CM | POA: Insufficient documentation

## 2016-04-24 DIAGNOSIS — F17210 Nicotine dependence, cigarettes, uncomplicated: Secondary | ICD-10-CM | POA: Insufficient documentation

## 2016-04-24 DIAGNOSIS — S20211A Contusion of right front wall of thorax, initial encounter: Secondary | ICD-10-CM | POA: Insufficient documentation

## 2016-04-24 DIAGNOSIS — S92421A Displaced fracture of distal phalanx of right great toe, initial encounter for closed fracture: Secondary | ICD-10-CM | POA: Insufficient documentation

## 2016-04-24 MED ORDER — HYDROCODONE-ACETAMINOPHEN 5-325 MG PO TABS: 1.0000 | ORAL_TABLET | ORAL | 0 refills | Status: DC | PRN

## 2016-04-24 MED ORDER — KETOROLAC TROMETHAMINE 60 MG/2ML IM SOLN
60.0000 mg | Freq: Once | INTRAMUSCULAR | Status: AC
  Administered 2016-04-24: 60 mg via INTRAMUSCULAR
  Filled 2016-04-24: qty 2

## 2016-04-24 MED FILL — HYDROCODON-APAP 5-325: 5-325 | 2 days supply | Qty: 10 | Fill #0

## 2016-04-24 NOTE — ED Provider Notes (Signed)
[facility]   CSN: [id number] Arrival date & time: 04/24/16  1118     History   Chief Complaint Chief Complaint  Patient presents with  . Fall    HPI Billy White is a 50 y.o. male presenting with right rib pain and right great toe pain after a fall. He states he tripped and fell on the road 2 nights ago. Hit his right chest on the curb. Is not sure what it is still on. Has been having pain since. Right great toe is swollen and bruised. Also having a little bit of left knee pain. Has not taken anything for the pain.  HPI  Past Medical History:  Diagnosis Date  . Chronic back pain   . Depression   . GERD (gastroesophageal reflux disease)   . History of chicken pox     Patient Active Problem List   Diagnosis Date Noted  . Laceration of foot 01/06/2015  . Sciatica 12/14/2014  . Depression 12/14/2014  . Chronic back pain   . Back pain with radiation 10/24/2011  . Arm pain, left 10/24/2011  . GERD (gastroesophageal reflux disease) 10/24/2011  . Headache(784.0) 10/24/2011  . Anxiety associated with depression 10/24/2011    Past Surgical History:  Procedure Laterality Date  . KNEE SURGERY    . NOSE SURGERY         Home Medications    Prior to Admission medications   Medication Sig Start Date End Date Taking? Authorizing Provider  HYDROcodone-acetaminophen (NORCO) 5-325 MG tablet Take 1 tablet by mouth every 4 (four) hours as needed. 04/24/16   Pricilla LovelessScott Veneta Sliter, MD  meloxicam (MOBIC) 7.5 MG tablet Take 1 tablet (7.5 mg total) by mouth daily. 11/18/15   Cheri FowlerKayla Rose, PA-C  traMADol (ULTRAM) 50 MG tablet Take 1 tablet (50 mg total) by mouth every 12 (twelve) hours as needed. 01/06/15   Waldon MerlWilliam C Martin, PA-C    Family History Family History  Problem Relation Age of Onset  . Arthritis Father   . Hypertension Father     Social History Social History  Substance Use Topics  . Smoking status: Current Every Day Smoker    Types: Cigars  .  Smokeless tobacco: Never Used  . Alcohol use Yes     Comment:  1 beer daily     Allergies   Tramadol   Review of Systems Review of Systems  Respiratory: Negative for shortness of breath.   Cardiovascular: Positive for chest pain.  Gastrointestinal: Negative for abdominal pain and vomiting.  Musculoskeletal: Positive for arthralgias and joint swelling.  All other systems reviewed and are negative.    Physical Exam Updated Vital Signs BP 124/80 (BP Location: Right Arm)   Pulse (!) 57   Temp 98.2 F (36.8 C) (Oral)   Resp 18   Ht 6\' 1"  (1.854 m)   Wt 215 lb (97.5 kg)   SpO2 97%   BMI 28.37 kg/m   Physical Exam  Constitutional: He is oriented to person, place, and time. He appears well-developed and well-nourished.  HENT:  Head: Normocephalic and atraumatic.  Right Ear: External ear normal.  Left Ear: External ear normal.  Nose: Nose normal.  Eyes: Right eye exhibits no discharge. Left eye exhibits no discharge.  Neck: Neck supple.  Cardiovascular: Normal rate, regular rhythm, normal heart sounds and intact distal pulses.   Pulmonary/Chest: Effort normal and breath sounds normal. He exhibits tenderness.    Abdominal: Soft. He exhibits no distension. There is no  tenderness.  Musculoskeletal: He exhibits no edema.       Left knee: He exhibits normal range of motion and no swelling. Tenderness (mild) found.       Right foot: There is tenderness and swelling.       Feet:  Neurological: He is alert and oriented to person, place, and time.  Skin: Skin is warm and dry.  Nursing note and vitals reviewed.    ED Treatments / Results  Labs (all labs ordered are listed, but only abnormal results are displayed) Labs Reviewed - No data to display  EKG  EKG Interpretation None       Radiology Dg Ribs Unilateral W/chest Right  Result Date: 04/24/2016 CLINICAL DATA:  Status post trip and fall 04/22/2016 with a blow to the right side of the chest. Pain and bruising.  Initial encounter. EXAM: RIGHT RIBS AND CHEST - 3+ VIEW COMPARISON:  PA and lateral chest 09/08/2012. FINDINGS: The lungs are clear. No pneumothorax or pleural effusion. Heart size is upper normal. No acute fracture is identified. Remote right fourth rib fracture is noted. IMPRESSION: Negative for acute fracture.  No acute disease. Electronically Signed   By: Drusilla Kanner M.D.   On: 04/24/2016 11:45   Dg Knee Complete 4 Views Left  Result Date: 04/24/2016 CLINICAL DATA:  Fall 2 days ago with left knee pain. EXAM: LEFT KNEE - COMPLETE 4+ VIEW COMPARISON:  None. FINDINGS: No evidence of fracture, dislocation, or joint effusion. No evidence of arthropathy or other focal bone abnormality. Soft tissues are unremarkable. IMPRESSION: Negative. Electronically Signed   By: Elberta Fortis M.D.   On: 04/24/2016 12:17   Dg Toe Great Right  Result Date: 04/24/2016 CLINICAL DATA:  Fall 2 days ago with right great toe pain and bruising. EXAM: RIGHT GREAT TOE COMPARISON:  None. FINDINGS: Examination demonstrates a minimally displaced fracture over the lateral base of the first distal phalanx with involvement of the articular surface. Minimal degenerative change of the first MTP joint. IMPRESSION: Minimally displaced fracture over the lateral base of the first distal phalanx. Electronically Signed   By: Elberta Fortis M.D.   On: 04/24/2016 12:18    Procedures Procedures (including critical care time)  Medications Ordered in ED Medications  ketorolac (TORADOL) injection 60 mg (60 mg Intramuscular Given 04/24/16 1222)     Initial Impression / Assessment and Plan / ED Course  I have reviewed the triage vital signs and the nursing notes.  Pertinent labs & imaging results that were available during my care of the patient were reviewed by me and considered in my medical decision making (see chart for details).  Clinical Course  Comment By Time  Rib xray benign. Abd exam benign. Will xray great toe and knee.  Toradol IM for pain. Pricilla Loveless, MD 09/11 1158  Pain is better after toradol. Will d/c with short course of norco, post op shoe for toe, and ortho follow up. Pricilla Loveless, MD 09/11 1254    Final Clinical Impressions(s) / ED Diagnoses   Final diagnoses:  Fall, initial encounter  Closed fracture of great toe of right foot, initial encounter  Chest wall contusion, right, initial encounter    New Prescriptions Discharge Medication List as of 04/24/2016 12:54 PM    START taking these medications   Details  HYDROcodone-acetaminophen (NORCO) 5-325 MG tablet Take 1 tablet by mouth every 4 (four) hours as needed., Starting Mon 04/24/2016, Print         Pricilla Loveless, MD 04/24/16  1537  

## 2016-04-24 NOTE — Discharge Instructions (Signed)
Your x-ray revealed a fourth rib fracture on the right side. Use ice on your ribs as often as you can, 20 min on and 20 min off, and be sure to keep a thin cloth between your skin and the ice. Take the Norco as needed for pain and ibuprofen for break through pain. Keep your appointment with the orthopedist tomorrow regarding your toe. Follow-up with your primary care provider in 3-4 days if you're rib pain is improving.  Return to the emergency department if you experience shortness of breath, worsening rib pain, chest pain, fever, abdominal pain, nausea, vomiting or any other concerning symptoms.

## 2016-04-24 NOTE — ED Triage Notes (Signed)
Presents post fall Saturday night, while walking across road pt tripped and fell and landed on his right side on a concrete block. Right great toe swollen and bruised and painful, right sided chest pain post fall worse with inspiration and movement. Accident occurred Saturday evening. Breath sounds clear.

## 2016-04-24 NOTE — ED Triage Notes (Signed)
Pt was seen at [facility] for the same, he continues to complain of right sided rib pain

## 2016-04-24 NOTE — ED Provider Notes (Signed)
[facility]-EMERGENCY DEPT Provider Note   CSN: [id number] Arrival date & time: 04/24/16  2042     History   Chief Complaint Chief Complaint  Patient presents with  . Fall    HPI Billy White is a 50 y.o. male.  HPI   Patient is a 50 year old male who presents to the emergency department with right rib pain after a fall that occurred roughly 2 nights ago. He states he hit his chest on the curb when he fell. He's had constant pain since the fall, worse with movement and worse with deep breath. He was seen at [facility] tonight where he was told he did not have a rib fracture. Patient has taken one Norco tonight with little relief. He denies associated symptoms.   Past Medical History:  Diagnosis Date  . Chronic back pain   . Depression   . GERD (gastroesophageal reflux disease)   . History of chicken pox     Patient Active Problem List   Diagnosis Date Noted  . Laceration of foot 01/06/2015  . Sciatica 12/14/2014  . Depression 12/14/2014  . Chronic back pain   . Back pain with radiation 10/24/2011  . Arm pain, left 10/24/2011  . GERD (gastroesophageal reflux disease) 10/24/2011  . Headache(784.0) 10/24/2011  . Anxiety associated with depression 10/24/2011    Past Surgical History:  Procedure Laterality Date  . KNEE SURGERY    . NOSE SURGERY         Home Medications    Prior to Admission medications   Medication Sig Start Date End Date Taking? Authorizing Provider  HYDROcodone-acetaminophen (NORCO) 5-325 MG tablet Take 1 tablet by mouth every 4 (four) hours as needed. 04/24/16   Pricilla Loveless, MD  meloxicam (MOBIC) 7.5 MG tablet Take 1 tablet (7.5 mg total) by mouth daily. 11/18/15   Cheri Fowler, PA-C  traMADol (ULTRAM) 50 MG tablet Take 1 tablet (50 mg total) by mouth every 12 (twelve) hours as needed. 01/06/15   Waldon Merl, PA-C    Family History Family History  Problem Relation Age of Onset  . Arthritis Father   . Hypertension Father      Social History Social History  Substance Use Topics  . Smoking status: Current Every Day Smoker    Types: Cigars  . Smokeless tobacco: Never Used  . Alcohol use Yes     Comment:  1 beer daily     Allergies   Tramadol   Review of Systems Review of Systems  Constitutional: Negative for fever.  Eyes: Negative for visual disturbance.  Respiratory: Negative for shortness of breath.   Cardiovascular: Positive for chest pain.  Gastrointestinal: Negative for abdominal pain, nausea and vomiting.  Neurological: Negative for dizziness, numbness and headaches.     Physical Exam Updated Vital Signs BP 120/83 (BP Location: Left Arm)   Pulse (!) 54   Resp 18   SpO2 95%   Physical Exam  Constitutional: He appears well-developed and well-nourished. No distress.  HENT:  Head: Normocephalic and atraumatic.  Eyes: Conjunctivae are normal.  Cardiovascular: Regular rhythm and normal heart sounds.  Bradycardia present.  Exam reveals no gallop and no friction rub.   No murmur heard. Pulmonary/Chest: Effort normal and breath sounds normal. No respiratory distress. He has no decreased breath sounds. He has no wheezes. He has no rhonchi. He has no rales. He exhibits tenderness. He exhibits no laceration, no crepitus, no edema, no deformity and no swelling.    No ecchymosis  Musculoskeletal: Normal range of motion.  Ecchymosis swelling noted to right great toe, patient in postop shoe  Neurological: He is alert. Coordination normal.  Skin: Skin is warm and dry. He is not diaphoretic.  Psychiatric: He has a normal mood and affect. His behavior is normal.  Nursing note and vitals reviewed.    ED Treatments / Results  Labs (all labs ordered are listed, but only abnormal results are displayed) Labs Reviewed - No data to display  EKG  EKG Interpretation None       Radiology Dg Ribs Unilateral W/chest Right  Result Date: 04/24/2016 CLINICAL DATA:  Status post trip and fall  04/22/2016 with a blow to the right side of the chest. Pain and bruising. Initial encounter. EXAM: RIGHT RIBS AND CHEST - 3+ VIEW COMPARISON:  PA and lateral chest 09/08/2012. FINDINGS: The lungs are clear. No pneumothorax or pleural effusion. Heart size is upper normal. No acute fracture is identified. Remote right fourth rib fracture is noted. IMPRESSION: Negative for acute fracture.  No acute disease. Electronically Signed   By: Drusilla Kanner M.D.   On: 04/24/2016 11:45   Dg Knee Complete 4 Views Left  Result Date: 04/24/2016 CLINICAL DATA:  Fall 2 days ago with left knee pain. EXAM: LEFT KNEE - COMPLETE 4+ VIEW COMPARISON:  None. FINDINGS: No evidence of fracture, dislocation, or joint effusion. No evidence of arthropathy or other focal bone abnormality. Soft tissues are unremarkable. IMPRESSION: Negative. Electronically Signed   By: Elberta Fortis M.D.   On: 04/24/2016 12:17   Dg Toe Great Right  Result Date: 04/24/2016 CLINICAL DATA:  Fall 2 days ago with right great toe pain and bruising. EXAM: RIGHT GREAT TOE COMPARISON:  None. FINDINGS: Examination demonstrates a minimally displaced fracture over the lateral base of the first distal phalanx with involvement of the articular surface. Minimal degenerative change of the first MTP joint. IMPRESSION: Minimally displaced fracture over the lateral base of the first distal phalanx. Electronically Signed   By: Elberta Fortis M.D.   On: 04/24/2016 12:18    Procedures Procedures (including critical care time)  Medications Ordered in ED Medications - No data to display   Initial Impression / Assessment and Plan / ED Course  I have reviewed the triage vital signs and the nursing notes.  Pertinent labs & imaging results that were available during my care of the patient were reviewed by me and considered in my medical decision making (see chart for details).  Clinical Course   Patient with rib fracture after fall 2 days ago. Patient was seen at  [facility] [place] for the same. Impression of rib x-rays did not state fracture but the findings stated fourth right rib fracture. Patient was told he did not have a rib fracture earlier this evening. He wanted a second opinion. I instructed the patient to use ice on his ribs. He received Norco earlier this evening. Instructed patient to follow-up his primary care provider in 2-3 days if symptoms are not improving. Instructed patient to keep his orthopedics appointment tomorrow. Instructed him to continue to take deep breaths. Discussed strict return precautions as ED. Patient expressed understanding to the discharge instructions.  Final Clinical Impressions(s) / ED Diagnoses   Final diagnoses:  Fall, subsequent encounter  Rib pain on right side  Rib fracture, unspecified laterality, closed, initial encounter    New Prescriptions Discharge Medication List as of 04/24/2016 10:17 PM       Jerre Simon, PA 04/24/16 2232  Lorre NickAnthony Allen, MD 04/25/16 [phone]

## 2016-04-24 NOTE — ED Notes (Signed)
Patient transported to X-ray 

## 2016-04-25 ENCOUNTER — Ambulatory Visit (INDEPENDENT_AMBULATORY_CARE_PROVIDER_SITE_OTHER): Payer: Self-pay | Admitting: Family Medicine

## 2016-04-25 DIAGNOSIS — S92911A Unspecified fracture of right toe(s), initial encounter for closed fracture: Secondary | ICD-10-CM

## 2016-04-25 DIAGNOSIS — S20211A Contusion of right front wall of thorax, initial encounter: Secondary | ICD-10-CM

## 2016-04-25 MED ORDER — OXYCODONE-ACETAMINOPHEN 10-325 MG PO TABS: 1.0000 | ORAL_TABLET | Freq: Four times a day (QID) | ORAL | 0 refills | Status: DC | PRN

## 2016-04-25 MED FILL — OXYCODONE-APAP 10-325: 10-325 | 8 days supply | Qty: 30 | Fill #0

## 2016-04-25 NOTE — Patient Instructions (Signed)
You have a rib contusion and a great toe fracture. Ice the areas 15 minutes at a times 3-4 times a day. Elevate your foot as needed for swelling. Ibuprofen 600mg  three times a day with food OR aleve 2 tabs twice a day with food for pain and inflammation. Oxycodone as needed for severe pain (we do not refill this medication though). Postop shoe (or boot) with buddy taping when up and walking around. Follow up with me in 4 weeks for reevaluation. The fracture will probably take 6 weeks to resolve, the rib contusion 2-8 weeks.

## 2016-04-26 ENCOUNTER — Telehealth: Payer: Self-pay | Admitting: Family Medicine

## 2016-04-26 NOTE — Telephone Encounter (Signed)
Spoke to patient and gave him the information provided by the physician and advised him that he could cut the pill in half. Patient stated he was cutting the pill in half and it was still keeping him awake.

## 2016-04-26 NOTE — Telephone Encounter (Signed)
No, we cannot provide another narcotic prescription even if it's a lower dose.  He can cut the pills in half if necessary.

## 2016-04-27 ENCOUNTER — Encounter: Payer: Self-pay | Admitting: Family Medicine

## 2016-04-27 DIAGNOSIS — S20219A Contusion of unspecified front wall of thorax, initial encounter: Secondary | ICD-10-CM | POA: Insufficient documentation

## 2016-04-27 DIAGNOSIS — S92911A Unspecified fracture of right toe(s), initial encounter for closed fracture: Secondary | ICD-10-CM | POA: Insufficient documentation

## 2016-04-27 NOTE — Progress Notes (Signed)
PCP: Piedad ClimesMartin, William Cody, PA-C  Subjective:   HPI: Patient is a 50 y.o. male here for right toe injury.  Patient reports on 9/9 he was crossing the road, tripped and fell to right side striking right side of chest on the curb. Not sure how he injured right great toe - swelling, pain here. Pain level is 10/10 and sharp. Not improving with norco. No prior injuries to ribs or great toe that he is aware of. No other skin changes, numbness.  Past Medical History:  Diagnosis Date  . Chronic back pain   . Depression   . GERD (gastroesophageal reflux disease)   . History of chicken pox     No current outpatient prescriptions on file prior to visit.   No current facility-administered medications on file prior to visit.     Past Surgical History:  Procedure Laterality Date  . KNEE SURGERY    . NOSE SURGERY      Allergies  Allergen Reactions  . Tramadol     itching    Social History   Social History  . Marital status: Married    Spouse name: N/A  . Number of children: 2  . Years of education: N/A   Occupational History  .  [employer]   Social History Main Topics  . Smoking status: Current Every Day Smoker    Types: Cigars  . Smokeless tobacco: Never Used  . Alcohol use Yes     Comment:  1 beer daily  . Drug use: No  . Sexual activity: Not on file   Other Topics Concern  . 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 Arthritis Father   . Hypertension Father     BP 126/75   Pulse (!) 52   Ht 6\' 7"  (2.007 m)   Wt 215 lb (97.5 kg)   BMI 24.22 kg/m   Review of Systems: See HPI above.    Objective:  Physical Exam:  Gen: NAD, comfortable in exam room  Right foot: Swelling, bruising throughout great toe.  Cap refill < 2 sec.  No malrotation or  angulation. FROM ankle and digits. TTP throughout great toe, especially distal phalanx Thompsons test negative. NV intact distally.  Chest: No stepoffs, deformities.  No swelling or bruising. TTP lateral aspect of ribs 4-6. Lungs CTAB with sounds heard in all fields.    Assessment & Plan:  1. Right great toe fracture - independently reviewed radiographs - distal phalanx fracture seen but nondisplaced.  Should do well with conservative treatment.  Switched to a boot from postop shoe for comfort.  Buddy taping.  Icing, nsaids with oxycodone as needed.  F/u in 4 weeks.  2. Right rib contusion - independently reviewed radiographs and no acute rib fracture seen.  Icing, ibuprofen with oxycodone as needed.  Expect 2-8 weeks to resolve.

## 2016-04-27 NOTE — Assessment & Plan Note (Signed)
independently reviewed radiographs - distal phalanx fracture seen but nondisplaced.  Should do well with conservative treatment.  Switched to a boot from postop shoe for comfort.  Buddy taping.  Icing, nsaids with oxycodone as needed.  F/u in 4 weeks.

## 2016-04-27 NOTE — Assessment & Plan Note (Signed)
Right rib contusion - independently reviewed radiographs and no acute rib fracture seen.  Icing, ibuprofen with oxycodone as needed.  Expect 2-8 weeks to resolve.

## 2016-05-25 ENCOUNTER — Ambulatory Visit: Payer: Self-pay | Admitting: Family Medicine

## 2017-12-22 ENCOUNTER — Encounter (HOSPITAL_COMMUNITY): Payer: Self-pay

## 2017-12-22 ENCOUNTER — Other Ambulatory Visit: Payer: Self-pay

## 2017-12-22 ENCOUNTER — Emergency Department (HOSPITAL_COMMUNITY)
Admission: EM | Admit: 2017-12-22 | Discharge: 2017-12-22 | Disposition: A | Discharge status: Home / Self Care | Payer: Self-pay | Attending: Emergency Medicine | Admitting: Emergency Medicine

## 2017-12-22 DIAGNOSIS — R1110 Vomiting, unspecified: Secondary | ICD-10-CM | POA: Insufficient documentation

## 2017-12-22 DIAGNOSIS — Z5321 Procedure and treatment not carried out due to patient leaving prior to being seen by health care provider: Secondary | ICD-10-CM | POA: Insufficient documentation

## 2017-12-22 DIAGNOSIS — R490 Dysphonia: Secondary | ICD-10-CM | POA: Insufficient documentation

## 2017-12-22 LAB — CBC
HEMATOCRIT: 48.5 % (ref 39.0–52.0)
Hemoglobin: 17.3 g/dL — ABNORMAL HIGH (ref 13.0–17.0)
MCH: 32.7 pg (ref 26.0–34.0)
MCHC: 35.7 g/dL (ref 30.0–36.0)
MCV: 91.7 fL (ref 78.0–100.0)
PLATELETS: 301 10*3/uL (ref 150–400)
RBC: 5.29 MIL/uL (ref 4.22–5.81)
RDW: 13.3 % (ref 11.5–15.5)
WBC: 12.4 10*3/uL — AB (ref 4.0–10.5)

## 2017-12-22 LAB — COMPREHENSIVE METABOLIC PANEL
ALBUMIN: 4.2 g/dL (ref 3.5–5.0)
ALT: 28 U/L (ref 17–63)
AST: 22 U/L (ref 15–41)
Alkaline Phosphatase: 60 U/L (ref 38–126)
Anion gap: 12 (ref 5–15)
BUN: 8 mg/dL (ref 6–20)
CHLORIDE: 100 mmol/L — AB (ref 101–111)
CO2: 21 mmol/L — ABNORMAL LOW (ref 22–32)
CREATININE: 1.15 mg/dL (ref 0.61–1.24)
Calcium: 9.5 mg/dL (ref 8.9–10.3)
GFR calc Af Amer: >60 mL/min (ref >60)
GFR calc non Af Amer: >60 mL/min (ref >60)
GLUCOSE: 100 mg/dL — AB (ref 65–99)
POTASSIUM: 3.6 mmol/L (ref 3.5–5.1)
Sodium: 133 mmol/L — ABNORMAL LOW (ref 135–145)
Total Bilirubin: 1.5 mg/dL — ABNORMAL HIGH (ref 0.3–1.2)
Total Protein: 8 g/dL (ref 6.5–8.1)

## 2017-12-22 LAB — URINALYSIS, ROUTINE W REFLEX MICROSCOPIC
BACTERIA UA: NONE SEEN
Bilirubin Urine: NEGATIVE
Glucose, UA: NEGATIVE mg/dL
Ketones, ur: NEGATIVE mg/dL
Nitrite: NEGATIVE
PROTEIN: 100 mg/dL — AB
SPECIFIC GRAVITY, URINE: 1.012 (ref 1.005–1.030)
pH: 5 (ref 5.0–8.0)

## 2017-12-22 LAB — LIPASE, BLOOD: LIPASE: 36 U/L (ref 11–51)

## 2017-12-22 NOTE — ED Notes (Signed)
Still no answer.  Removed when called.

## 2017-12-22 NOTE — ED Triage Notes (Addendum)
Pt endorses hoarseness x 3 weeks, began vomiting today and having difficulty breathing. Vss. Speaking in complete sentences. Pt states that he smoked weed today and it tasted funny.

## 2017-12-22 NOTE — ED Notes (Signed)
No answer when called for room 

## 2020-08-28 ENCOUNTER — Emergency Department (HOSPITAL_BASED_OUTPATIENT_CLINIC_OR_DEPARTMENT_OTHER): Payer: Self-pay

## 2020-08-28 ENCOUNTER — Other Ambulatory Visit: Payer: Self-pay

## 2020-08-28 ENCOUNTER — Encounter (HOSPITAL_BASED_OUTPATIENT_CLINIC_OR_DEPARTMENT_OTHER): Payer: Self-pay | Admitting: Emergency Medicine

## 2020-08-28 ENCOUNTER — Emergency Department (HOSPITAL_BASED_OUTPATIENT_CLINIC_OR_DEPARTMENT_OTHER)
Admission: EM | Admit: 2020-08-28 | Discharge: 2020-08-28 | Disposition: A | Discharge status: Home / Self Care | Payer: Self-pay | Attending: Emergency Medicine | Admitting: Emergency Medicine

## 2020-08-28 DIAGNOSIS — F17290 Nicotine dependence, other tobacco product, uncomplicated: Secondary | ICD-10-CM | POA: Insufficient documentation

## 2020-08-28 DIAGNOSIS — S92011A Displaced fracture of body of right calcaneus, initial encounter for closed fracture: Secondary | ICD-10-CM | POA: Insufficient documentation

## 2020-08-28 DIAGNOSIS — W07XXXA Fall from chair, initial encounter: Secondary | ICD-10-CM | POA: Insufficient documentation

## 2020-08-28 MED ORDER — HYDROMORPHONE HCL 1 MG/ML IJ SOLN
2.0000 mg | Freq: Once | INTRAMUSCULAR | Status: AC
  Administered 2020-08-28: 2 mg via INTRAMUSCULAR
  Filled 2020-08-28: qty 2

## 2020-08-28 MED ORDER — ONDANSETRON 4 MG PO TBDP
8.0000 mg | ORAL_TABLET | Freq: Once | ORAL | Status: AC
  Administered 2020-08-28: 8 mg via ORAL
  Filled 2020-08-28: qty 2

## 2020-08-28 MED ORDER — OXYCODONE-ACETAMINOPHEN 10-325 MG PO TABS: 1.0000 | ORAL_TABLET | ORAL | 0 refills | Status: DC | PRN

## 2020-08-28 NOTE — ED Notes (Signed)
Patient verbalizes understanding of discharge instructions. Opportunity for questioning and answers were provided. Armband removed by staff, pt discharged from ED ambulatory to home with crutches.

## 2020-08-28 NOTE — ED Provider Notes (Signed)
[facility] Provider Note: [name], MD, FACEP  CSN: [id number] MRN: [id number] ARRIVAL: 08/28/20 at 0340 ROOM: [facility]10/[facility]10   CHIEF COMPLAINT  Foot Injury   HISTORY OF PRESENT ILLNESS  08/28/20 5:25 AM Billy White is a 55 y.o. male who injured his right foot stepping down from a stool.  He is foot came down hard and he is now having severe pain in his right heel.  This occurred about midnight.  He rates his pain as a 10 out of 10, worse with attempted weightbearing or palpation.  He denies back pain or other injury.   Past Medical History:  Diagnosis Date  . Chronic back pain   . Depression   . GERD (gastroesophageal reflux disease)   . History of chicken pox     Past Surgical History:  Procedure Laterality Date  . KNEE SURGERY    . NOSE SURGERY      Family History  Problem Relation Age of Onset  . Arthritis Father   . Hypertension Father     Social History   Tobacco Use  . Smoking status: Current Every Day Smoker    Types: Cigars  . Smokeless tobacco: Never Used  Substance Use Topics  . Alcohol use: Yes    Comment:  1 beer daily  . Drug use: Yes    Frequency: 7.0 times per week    Types: Marijuana    Comment: smoke weed every day    Prior to Admission medications   Medication Sig Start Date End Date Taking? Authorizing Provider  oxyCODONE-acetaminophen (PERCOCET) 10-325 MG tablet Take 1 tablet by mouth every 4 (four) hours as needed for pain. 08/28/20  Yes Malon Siddall, MD    Allergies Tramadol   REVIEW OF SYSTEMS  Negative except as noted here or in the History of Present Illness.   PHYSICAL EXAMINATION  Initial Vital Signs Blood pressure 131/87, pulse 80, temperature 98.2 F (36.8 C), temperature source Oral, resp. rate 18, height 6\' 7"  (2.007 m), weight 112.5 kg, SpO2 97 %.  Examination General: Well-developed, well-nourished male in no acute distress; appearance consistent with age of record HENT: normocephalic;  atraumatic Eyes: Normal appearance Neck: supple Heart: regular rate and rhythm Lungs: clear to auscultation bilaterally Abdomen: soft; nondistended; nontender; bowel sounds present Back: No lumbar tenderness Extremities: Tenderness and swelling of right heel; pulses normal; right foot grossly neurovascularly intact Neurologic: Awake, alert and oriented; motor function intact in all extremities and symmetric; no facial droop Skin: Warm and dry Psychiatric: Normal mood and affect   RESULTS  Summary of this visit's results, reviewed and interpreted by myself:   EKG Interpretation  Date/Time:    Ventricular Rate:    PR Interval:    QRS Duration:   QT Interval:    QTC Calculation:   R Axis:     Text Interpretation:        Laboratory Studies: No results found for this or any previous visit (from the past 24 hour(s)). Imaging Studies: DG Ankle Complete Right  Result Date: 08/28/2020 CLINICAL DATA:  55 year old male with pain after right ankle injury. EXAM: RIGHT ANKLE - COMPLETE 3+ VIEW COMPARISON:  Right tib fib series 02/25/2011. FINDINGS: Bone mineralization is within normal limits for age. Mortise joint alignment preserved. Talar dome intact. Suspicion of small joint effusion on the lateral view, new compared to 2012. And there is a severely comminuted fracture of the right calcaneus. Mild associated calcaneal collapse. Talus remains intact. Distal tibia and fibula  remain intact. Other visible bones of the right foot appear stable and intact. IMPRESSION: 1. Positive for acute highly comminuted calcaneus fracture. 2. Small right ankle joint effusion suspected but no other acute fracture identified. Electronically Signed   By: Odessa Fleming M.D.   On: 08/28/2020 05:22    ED COURSE and MDM  Nursing notes, initial and subsequent vitals signs, including pulse oximetry, reviewed and interpreted by myself.  Vitals:   08/28/20 0348 08/28/20 0349 08/28/20 0529  BP:  131/87 134/88  Pulse:  80  80  Resp:  18 16  Temp:  98.2 F (36.8 C)   TempSrc:  Oral   SpO2:  97% 100%  Weight: 112.5 kg    Height: 6\' 7"  (2.007 m)     Medications  ondansetron (ZOFRAN-ODT) disintegrating tablet 8 mg (8 mg Oral Given 08/28/20 0525)  HYDROmorphone (DILAUDID) injection 2 mg (2 mg Intramuscular Given 08/28/20 0528)   Radiographs show a comminuted fracture of the right calcaneus.  We will place the patient in a Jones dressing and crutches and have him follow-up with orthopedics.   PROCEDURES  Procedures   ED DIAGNOSES     ICD-10-CM   1. Closed displaced fracture of body of right calcaneus, initial encounter  S92.011A        Tesslyn Baumert, 08/30/20, MD 08/28/20 [phone]

## 2020-08-28 NOTE — ED Triage Notes (Signed)
Pt has injury to right ankle after stepping out of chair hard.

## 2021-03-07 ENCOUNTER — Emergency Department (HOSPITAL_BASED_OUTPATIENT_CLINIC_OR_DEPARTMENT_OTHER)
Admission: EM | Admit: 2021-03-07 | Discharge: 2021-03-07 | Disposition: A | Discharge status: Home / Self Care | Payer: Self-pay | Attending: Emergency Medicine | Admitting: Emergency Medicine

## 2021-03-07 ENCOUNTER — Other Ambulatory Visit: Payer: Self-pay

## 2021-03-07 ENCOUNTER — Other Ambulatory Visit (HOSPITAL_BASED_OUTPATIENT_CLINIC_OR_DEPARTMENT_OTHER): Payer: Self-pay

## 2021-03-07 ENCOUNTER — Encounter (HOSPITAL_BASED_OUTPATIENT_CLINIC_OR_DEPARTMENT_OTHER): Payer: Self-pay | Admitting: Emergency Medicine

## 2021-03-07 DIAGNOSIS — U071 COVID-19: Secondary | ICD-10-CM | POA: Insufficient documentation

## 2021-03-07 DIAGNOSIS — F17290 Nicotine dependence, other tobacco product, uncomplicated: Secondary | ICD-10-CM | POA: Insufficient documentation

## 2021-03-07 LAB — RESP PANEL BY RT-PCR (FLU A&B, COVID) ARPGX2
Influenza A by PCR: NEGATIVE
Influenza B by PCR: NEGATIVE
SARS Coronavirus 2 by RT PCR: POSITIVE — AB

## 2021-03-07 MED ORDER — ONDANSETRON HCL 4 MG PO TABS
4.0000 mg | ORAL_TABLET | Freq: Three times a day (TID) | ORAL | 0 refills | Status: DC | PRN
  Filled 2021-03-07: qty 12, 4d supply, fill #0

## 2021-03-07 MED ORDER — NIRMATRELVIR/RITONAVIR (PAXLOVID)TABLET
3.0000 | ORAL_TABLET | Freq: Two times a day (BID) | ORAL | 0 refills | Status: AC
  Filled 2021-03-07: qty 30, 5d supply, fill #0

## 2021-03-07 NOTE — ED Provider Notes (Signed)
[facility] EMERGENCY DEPARTMENT Provider Note   CSN: [id number] Arrival date & time: 03/07/21  1109     History Chief Complaint  Patient presents with   Emesis   Headache    Billy White is a 55 y.o. male.  The history is provided by the patient and medical records. No language interpreter was used.  Emesis Severity:  Moderate Duration:  3 days Timing:  Intermittent Quality:  Stomach contents Progression:  Improving Chronicity:  New Recent urination:  Normal Relieved by:  Nothing Associated symptoms: chills, cough, diarrhea, fever and headaches (improving)   Associated symptoms: no abdominal pain   Headache Pain location:  Generalized Timing:  Constant Progression:  Improving Chronicity:  New Relieved by:  Nothing Worsened by:  Nothing Ineffective treatments:  None tried Associated symptoms: congestion, cough, diarrhea, fatigue, fever and vomiting   Associated symptoms: no abdominal pain, no back pain, no dizziness, no neck pain, no numbness, no seizures, no tingling, no visual change and no weakness       Past Medical History:  Diagnosis Date   Chronic back pain    Depression    GERD (gastroesophageal reflux disease)    History of chicken pox     Patient Active Problem List   Diagnosis Date Noted   Toe fracture, right 04/27/2016   Rib contusion 04/27/2016   Laceration of foot 01/06/2015   Sciatica 12/14/2014   Depression 12/14/2014   Closed fracture of right tibial plateau with routine healing 07/13/2014   Chronic back pain    Back pain with radiation 10/24/2011   Arm pain, left 10/24/2011   GERD (gastroesophageal reflux disease) 10/24/2011   Headache(784.0) 10/24/2011   Anxiety associated with depression 10/24/2011    Past Surgical History:  Procedure Laterality Date   KNEE SURGERY     NOSE SURGERY         Family History  Problem Relation Age of Onset   Arthritis Father    Hypertension Father     Social History    Tobacco Use   Smoking status: Every Day    Types: Cigars   Smokeless tobacco: Never  Substance Use Topics   Alcohol use: Yes    Comment:  1 beer daily   Drug use: Yes    Frequency: 7.0 times per week    Types: Marijuana    Comment: smoke weed every day    Home Medications Prior to Admission medications   Medication Sig Start Date End Date Taking? Authorizing Provider  oxyCODONE-acetaminophen (PERCOCET) 10-325 MG tablet Take 1 tablet by mouth every 4 (four) hours as needed for pain. 08/28/20   Molpus, John, MD    Allergies    Tramadol  Review of Systems   Review of Systems  Constitutional:  Positive for chills, fatigue and fever. Negative for diaphoresis.  HENT:  Positive for congestion.   Eyes:  Negative for visual disturbance.  Respiratory:  Positive for cough. Negative for chest tightness, shortness of breath and wheezing.   Cardiovascular:  Negative for chest pain and palpitations.  Gastrointestinal:  Positive for diarrhea and vomiting. Negative for abdominal pain and constipation.  Genitourinary:  Negative for flank pain and frequency.  Musculoskeletal:  Negative for back pain and neck pain.  Skin:  Negative for rash and wound.  Neurological:  Positive for headaches (improving). Negative for dizziness, seizures, weakness, light-headedness and numbness.  Psychiatric/Behavioral:  Negative for agitation.   All other systems reviewed and are negative.  Physical Exam Updated Vital Signs  BP 139/87 (BP Location: Left Arm)   Pulse (!) 58   Temp 98.7 F (37.1 C) (Oral)   Resp (!) 24   Ht 6\' 6"  (1.981 m)   Wt 111.1 kg   SpO2 98%   BMI 28.31 kg/m   Physical Exam Vitals and nursing note reviewed.  Constitutional:      General: He is not in acute distress.    Appearance: He is well-developed. He is not ill-appearing, toxic-appearing or diaphoretic.  HENT:     Head: Normocephalic and atraumatic.     Nose: No congestion or rhinorrhea.     Mouth/Throat:     Mouth:  Mucous membranes are moist.     Pharynx: No oropharyngeal exudate or posterior oropharyngeal erythema.  Eyes:     Conjunctiva/sclera: Conjunctivae normal.     Pupils: Pupils are equal, round, and reactive to light.  Cardiovascular:     Rate and Rhythm: Normal rate and regular rhythm.     Heart sounds: No murmur heard. Pulmonary:     Effort: Pulmonary effort is normal. No respiratory distress.     Breath sounds: Normal breath sounds.  Abdominal:     Palpations: Abdomen is soft.     Tenderness: There is no abdominal tenderness. There is no right CVA tenderness, left CVA tenderness, guarding or rebound.  Musculoskeletal:        General: No tenderness.     Cervical back: Neck supple. No tenderness.  Skin:    General: Skin is warm and dry.     Findings: No erythema.  Neurological:     General: No focal deficit present.     Mental Status: He is alert.    ED Results / Procedures / Treatments   Labs (all labs ordered are listed, but only abnormal results are displayed) Labs Reviewed  RESP PANEL BY RT-PCR (FLU A&B, COVID) ARPGX2 - Abnormal; Notable for the following components:      Result Value   SARS Coronavirus 2 by RT PCR POSITIVE (*)    All other components within normal limits    EKG None  Radiology No results found.  Procedures Procedures   Medications Ordered in ED Medications - No data to display  ED Course  I have reviewed the triage vital signs and the nursing notes.  Pertinent labs & imaging results that were available during my care of the patient were reviewed by me and considered in my medical decision making (see chart for details).    MDM Rules/Calculators/A&P                           Billy White is a 55 y.o. male with no significant past medical history who presents with COVID exposure and URI symptoms with nausea, vomiting, diarrhea, legs, and mild headache.  Patient reports this is symptoms been ongoing for several days ever since he was  exposed to COVID by coworker.  He is reporting he has had nausea vomiting yesterday had a fever 101 yesterday.  He reports his cough is improved but is still having the malaise fatigue and mild headache.  He says it is been waxing and waning and wanted to make sure he did not have COVID.  They report that they have been vaccinated but not boosted.  He denies any trauma or other complaints.  No urinary symptoms.  On exam, lungs clear and chest nontender.  Abdomen nontender.  No focal neurologic deficits.  No focal nuchal  rigidity or other acute abnormalities on exam.  Patient otherwise well-appearing with reassuring vital signs on arrival.  Clinically I do suspect he has COVID-19 and his lab test came back positive.  Flu negative.  We had a discussion about doing more extensive lab work-up or imaging however given his well appearance and improved symptoms compared to yesterday I suspect he is safe for discharge home.  Patient is still in the window for using Paxlovid and will also be given prescription for nausea medicine to help hydration.  They understand return precautions and follow-up instructions and had no other questions or concerns.  Given patient's well appearance we feel he safe for discharge home.  Patient discharged in good condition with understanding of plan of care.    Final Clinical Impression(s) / ED Diagnoses Final diagnoses:  COVID-19    Rx / DC Orders ED Discharge Orders          Ordered    ondansetron (ZOFRAN) 4 MG tablet  Every 8 hours PRN        03/07/21 1454    nirmatrelvir/ritonavir EUA (PAXLOVID) TABS  2 times daily       Note to Pharmacy: GFR was 61 last time it was checked   03/07/21 1454            Clinical Impression: 1. COVID-19     Disposition: Discharge  Condition: Good  I have discussed the results, Dx and Tx plan with the pt(& family if present). He/she/they expressed understanding and agree(s) with the plan. Discharge instructions discussed  at great length. Strict return precautions discussed and pt &/or family have verbalized understanding of the instructions. No further questions at time of discharge.    Discharge Medication List as of 03/07/2021  2:55 PM     START taking these medications   Details  nirmatrelvir/ritonavir EUA (PAXLOVID) TABS Take 3 tablets by mouth 2 (two) times daily for 5 days. Patient GFR is 61 last time it was checked. Take nirmatrelvir (150 mg) two tablets twice daily for 5 days and ritonavir (100 mg) one tablet twice daily for 5 days., Starting Mon 03/07/2021, Until Sa t 03/12/2021, Normal    ondansetron (ZOFRAN) 4 MG tablet Take 1 tablet (4 mg total) by mouth every 8 (eight) hours as needed for nausea or vomiting., Starting Mon 03/07/2021, Normal        Follow Up: [facility] [address] [place] [address] [phone] Schedule an appointment as soon as possible for a visit    [facility] [place] [facility] [id number] [place] [address] [phone]       Janine Reller, Canary Brim, MD 03/07/21 [phone]

## 2021-03-07 NOTE — Discharge Instructions (Addendum)
Your history, exam, work-up today are consistent with COVID-19 causing your symptoms.  You are still within the window of getting the medicine Paxlovid and your last kidney function was reassuring enough to take it.  Please rest and stay hydrated and treat any fevers at home.  Please use the nausea medicine as well.  If any symptoms change or worsen, please return to the nearest Emergency Department.

## 2021-03-07 NOTE — ED Triage Notes (Addendum)
Vomiting and h/a since sat night ,wants COVID test due to exposure

## 2021-04-04 ENCOUNTER — Other Ambulatory Visit: Payer: Self-pay

## 2022-02-13 IMAGING — DX DG ANKLE COMPLETE 3+V*R*
3 series · 3 of 3 positions shown · non-contrast
Comparison: Right tib fib series 02/25/2011.

CLINICAL DATA: 54-year-old male with pain after right ankle injury.

EXAM:
RIGHT ANKLE - COMPLETE 3+ VIEW

[ankle ap]
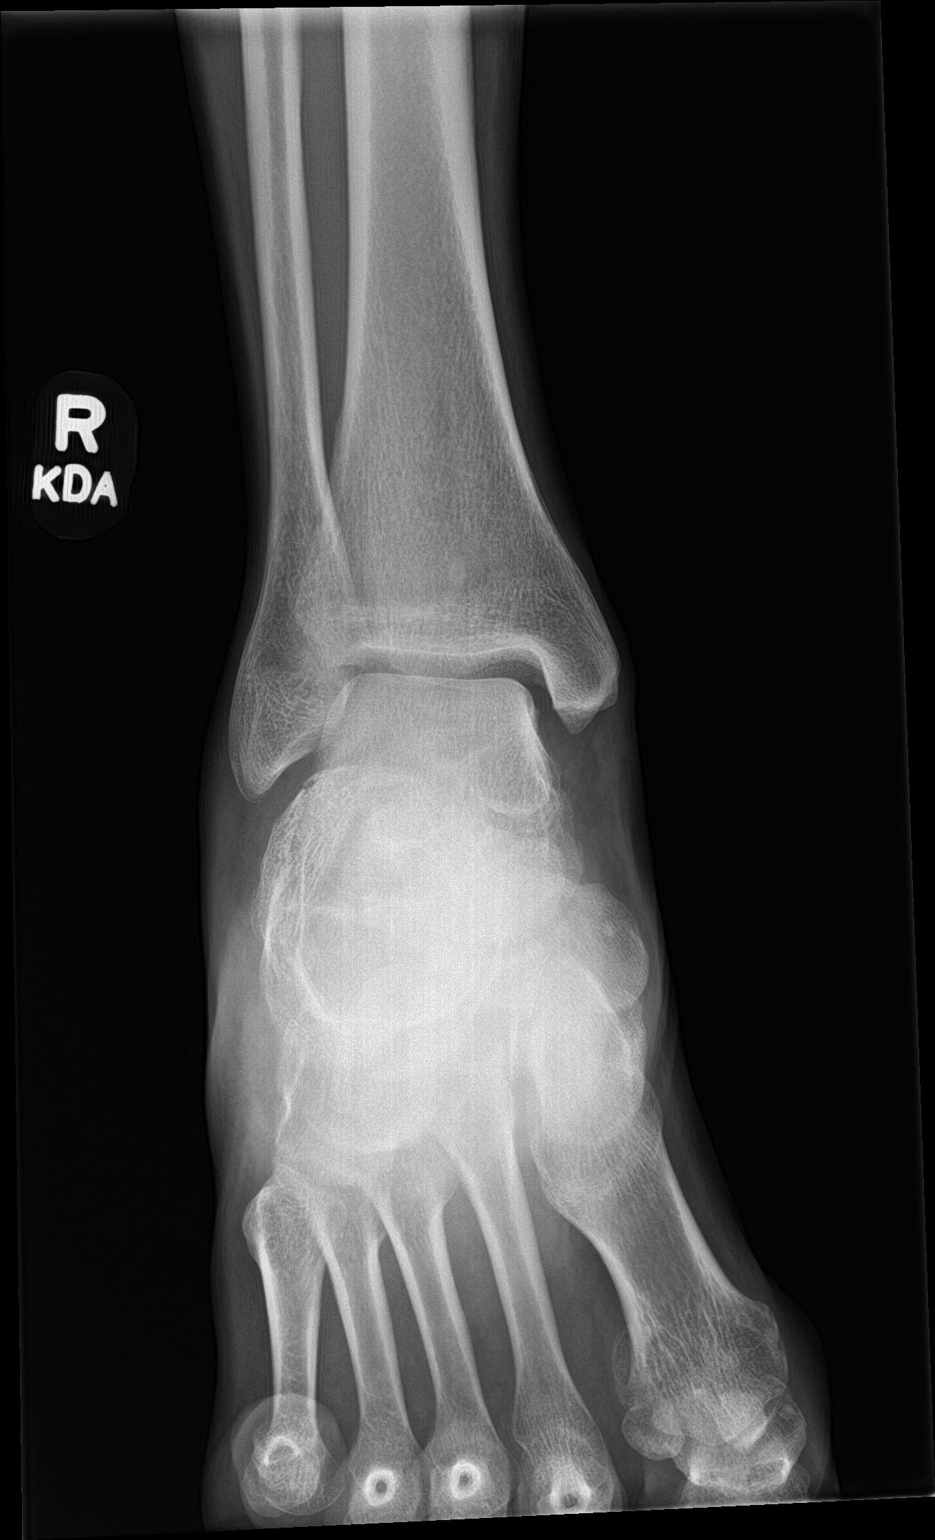

[ankle obl]
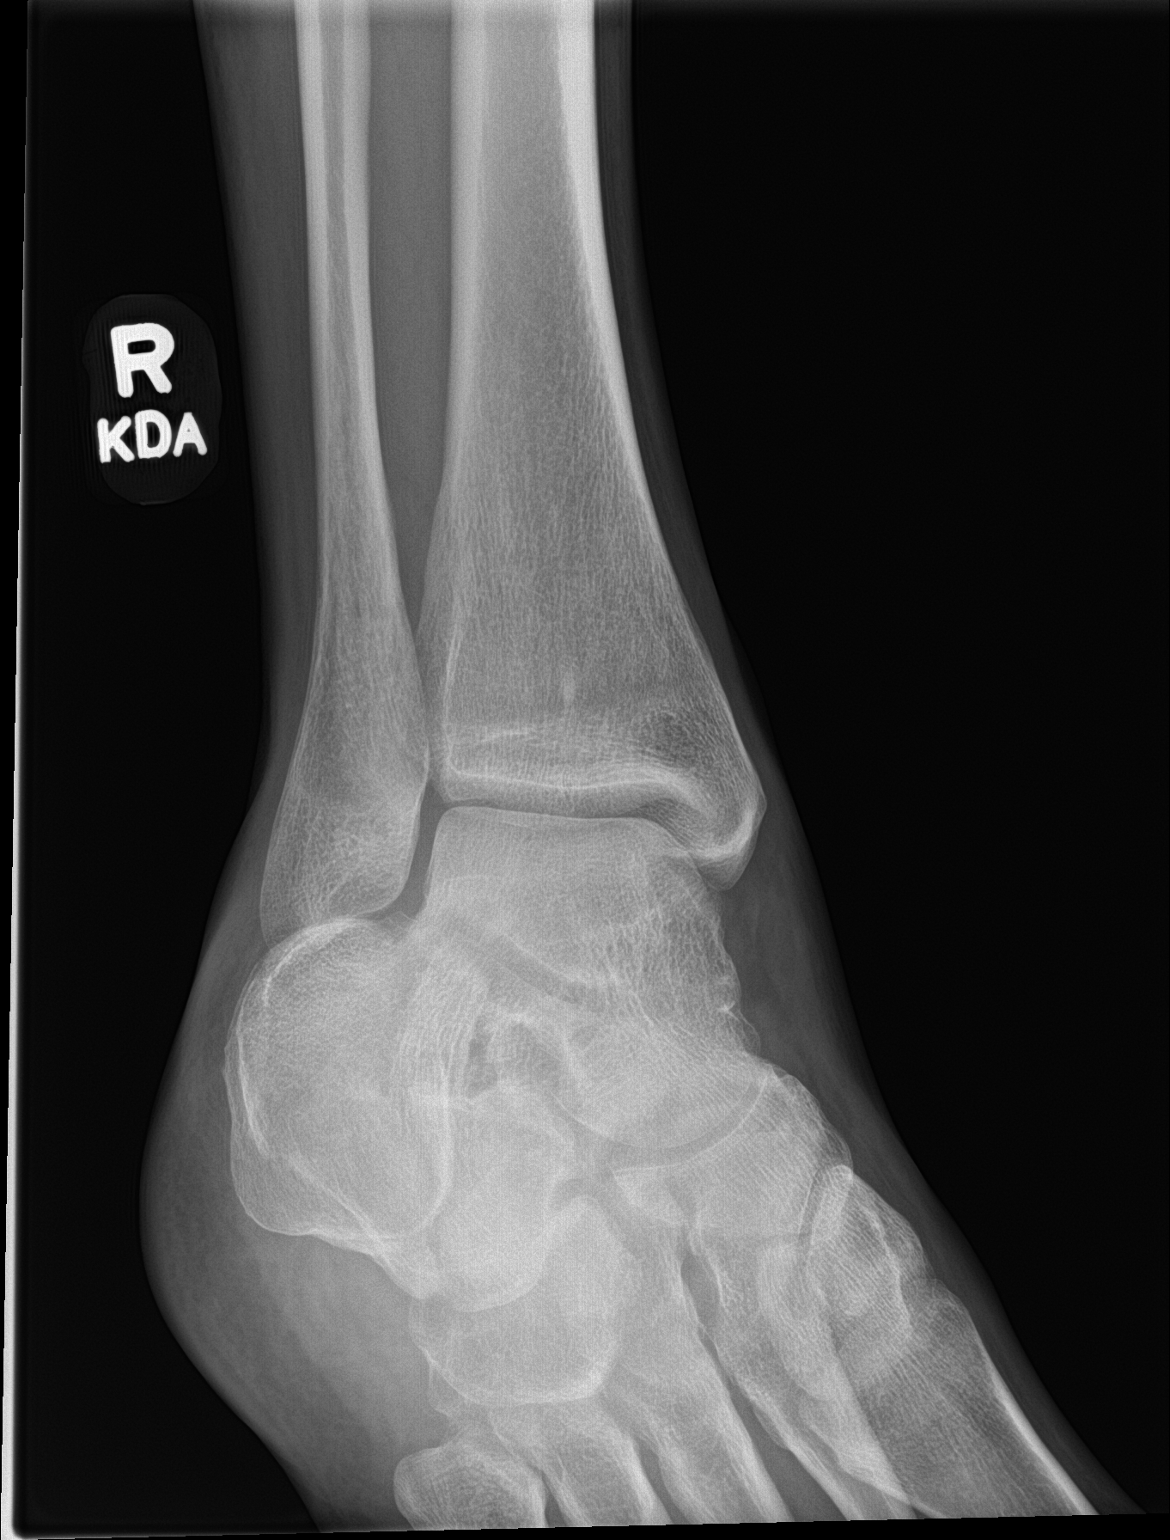

[ankle lat]
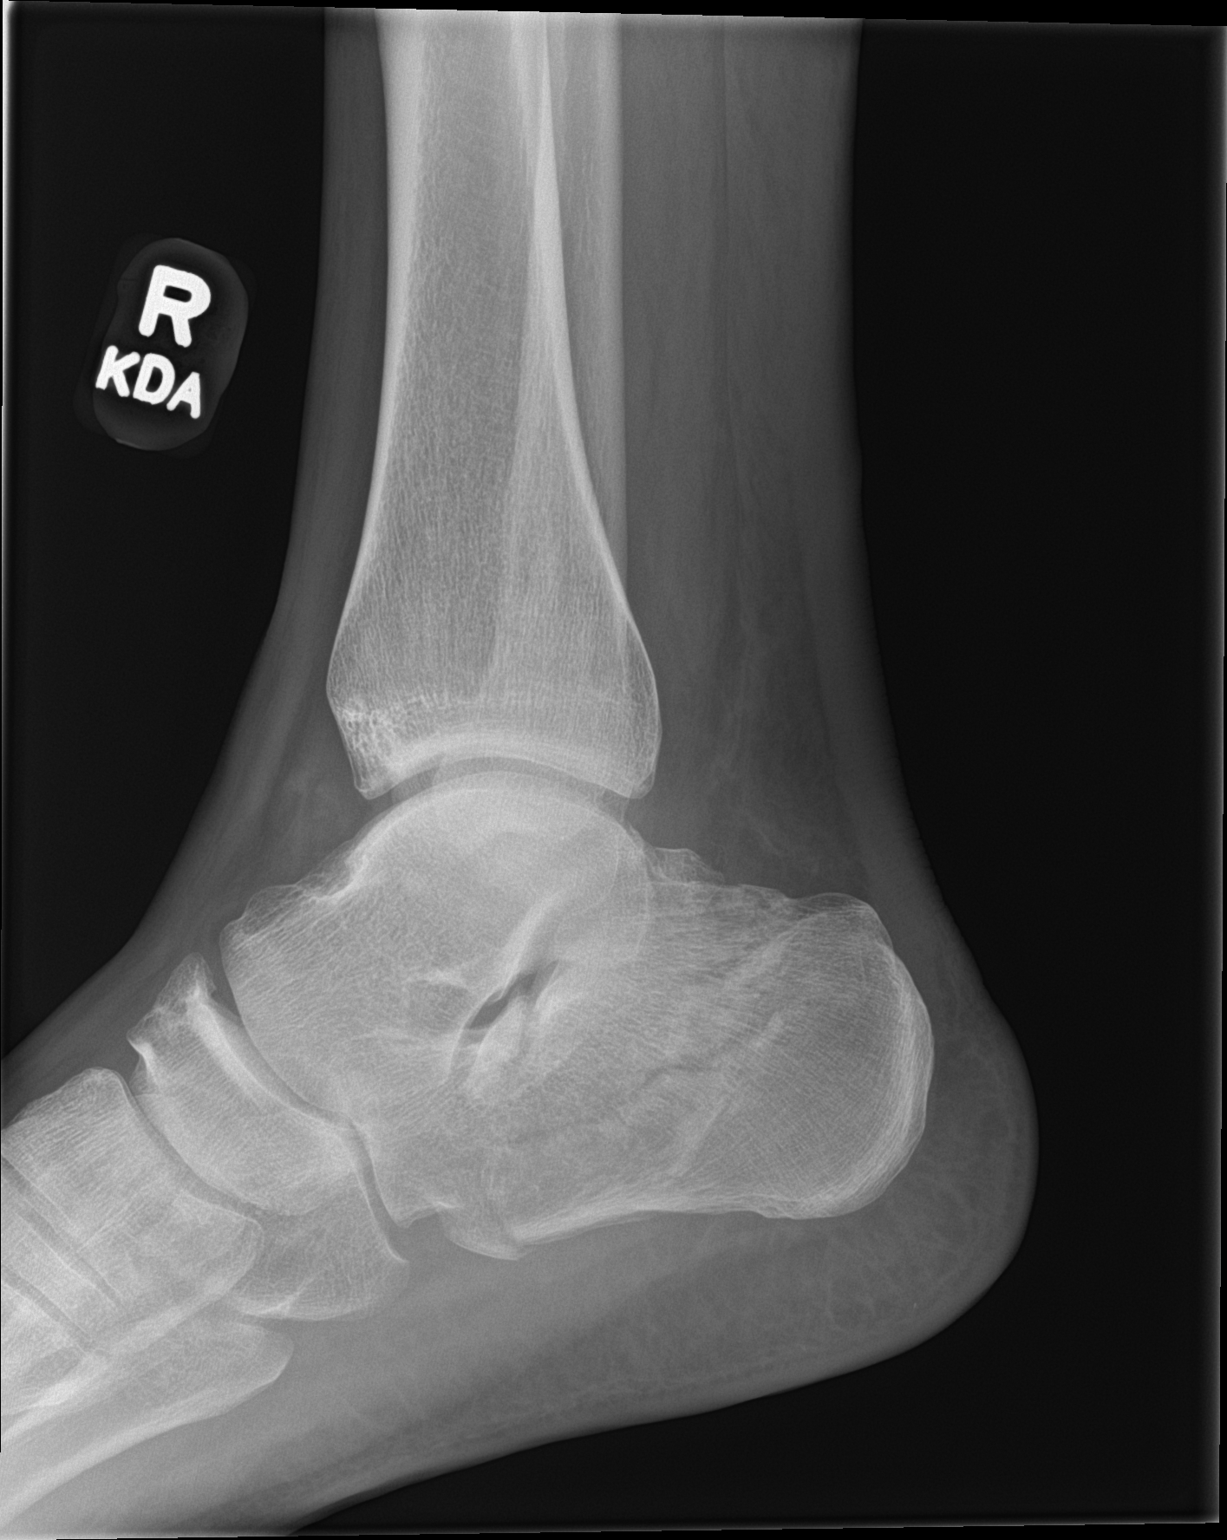

[3 of 3 positions shown; findings below may reference images not displayed]

FINDINGS: Bone mineralization is within normal limits for age. Mortise joint
alignment preserved. Talar dome intact. Suspicion of small joint
effusion on the lateral view, new compared to 1061.

And there is a severely comminuted fracture of the right calcaneus.
Mild associated calcaneal collapse. Talus remains intact. Distal
tibia and fibula remain intact. Other visible bones of the right
foot appear stable and intact.
IMPRESSION: 1. Positive for acute highly comminuted calcaneus fracture.
2. Small right ankle joint effusion suspected but no other acute
fracture identified.

## 2022-12-09 ENCOUNTER — Emergency Department (HOSPITAL_BASED_OUTPATIENT_CLINIC_OR_DEPARTMENT_OTHER)
Admission: EM | Admit: 2022-12-09 | Discharge: 2022-12-10 | Disposition: A | Discharge status: Home / Self Care | Payer: 59 | Attending: Emergency Medicine | Admitting: Emergency Medicine

## 2022-12-09 ENCOUNTER — Encounter (HOSPITAL_BASED_OUTPATIENT_CLINIC_OR_DEPARTMENT_OTHER): Payer: Self-pay | Admitting: Emergency Medicine

## 2022-12-09 ENCOUNTER — Other Ambulatory Visit: Payer: Self-pay

## 2022-12-09 ENCOUNTER — Emergency Department (HOSPITAL_BASED_OUTPATIENT_CLINIC_OR_DEPARTMENT_OTHER): Payer: 59

## 2022-12-09 DIAGNOSIS — Z1152 Encounter for screening for COVID-19: Secondary | ICD-10-CM | POA: Insufficient documentation

## 2022-12-09 DIAGNOSIS — R0789 Other chest pain: Secondary | ICD-10-CM | POA: Insufficient documentation

## 2022-12-09 DIAGNOSIS — R079 Chest pain, unspecified: Secondary | ICD-10-CM | POA: Diagnosis not present

## 2022-12-09 DIAGNOSIS — R072 Precordial pain: Secondary | ICD-10-CM | POA: Diagnosis not present

## 2022-12-09 LAB — CBC
HCT: 51.6 % (ref 39.0–52.0)
Hemoglobin: 18.3 g/dL — ABNORMAL HIGH (ref 13.0–17.0)
MCH: 33.5 pg (ref 26.0–34.0)
MCHC: 35.5 g/dL (ref 30.0–36.0)
MCV: 94.5 fL (ref 80.0–100.0)
Platelets: 232 10*3/uL (ref 150–400)
RBC: 5.46 MIL/uL (ref 4.22–5.81)
RDW: 14.2 % (ref 11.5–15.5)
WBC: 8.1 10*3/uL (ref 4.0–10.5)
nRBC: 0 % (ref 0.0–0.2)

## 2022-12-09 LAB — RESP PANEL BY RT-PCR (RSV, FLU A&B, COVID)  RVPGX2
Influenza A by PCR: NEGATIVE
Influenza B by PCR: NEGATIVE
Resp Syncytial Virus by PCR: NEGATIVE
SARS Coronavirus 2 by RT PCR: NEGATIVE

## 2022-12-09 LAB — BASIC METABOLIC PANEL
Anion gap: 14 (ref 5–15)
BUN: 9 mg/dL (ref 6–20)
CO2: 22 mmol/L (ref 22–32)
Calcium: 9.3 mg/dL (ref 8.9–10.3)
Chloride: 106 mmol/L (ref 98–111)
Creatinine, Ser: 1.03 mg/dL (ref 0.61–1.24)
GFR, Estimated: >60 mL/min (ref >60)
Glucose, Bld: 83 mg/dL (ref 70–99)
Potassium: 3.9 mmol/L (ref 3.5–5.1)
Sodium: 142 mmol/L (ref 135–145)

## 2022-12-09 LAB — D-DIMER, QUANTITATIVE: D-Dimer, Quant: 1.55 ug/mL-FEU — ABNORMAL HIGH (ref 0.00–0.50)

## 2022-12-09 LAB — TROPONIN I (HIGH SENSITIVITY): Troponin I (High Sensitivity): 5 ng/L (ref <18)

## 2022-12-09 MED ORDER — IPRATROPIUM BROMIDE 0.02 % IN SOLN: 0.5000 mg | Freq: Once | RESPIRATORY_TRACT | Status: DC

## 2022-12-09 MED ORDER — METHYLPREDNISOLONE SODIUM SUCC 125 MG IJ SOLR
125.0000 mg | Freq: Once | INTRAMUSCULAR | Status: AC
  Administered 2022-12-09: 125 mg via INTRAVENOUS
  Filled 2022-12-09: qty 2

## 2022-12-09 MED ORDER — IPRATROPIUM-ALBUTEROL 0.5-2.5 (3) MG/3ML IN SOLN
3.0000 mL | Freq: Once | RESPIRATORY_TRACT | Status: AC
Start: 2022-12-09 — End: 2022-12-09
  Administered 2022-12-09: 3 mL via RESPIRATORY_TRACT
  Filled 2022-12-09: qty 3

## 2022-12-09 MED ORDER — ALBUTEROL (5 MG/ML) CONTINUOUS INHALATION SOLN: 10.0000 mg/h | INHALATION_SOLUTION | Freq: Once | RESPIRATORY_TRACT | Status: DC

## 2022-12-09 MED ORDER — MAGNESIUM SULFATE 2 GM/50ML IV SOLN
2.0000 g | Freq: Once | INTRAVENOUS | Status: AC
  Administered 2022-12-09: 2 g via INTRAVENOUS
  Filled 2022-12-09: qty 50

## 2022-12-09 NOTE — ED Triage Notes (Signed)
L sided CP that started at rest 2 days ago, sharp, now preventing hom from ambulating and caused a fall from pain. Endorses radiation into L arm, SOB. Smokes 5 cigars a day. No cardiac hx he knows of, no meds pta. Denies dizziness, fever, chills.

## 2022-12-09 NOTE — ED Provider Notes (Signed)
[place] EMERGENCY DEPARTMENT AT [facility] Provider Note   CSN: [id number] Arrival date & time: 12/09/22  2133     History {Add pertinent medical, surgical, social history, OB history to HPI:1} Chief Complaint  Patient presents with   Chest Pain   HPI Billy White is a 57 y.o. male with history of GERD presenting for chest pain.  Started 2 days ago.  Primarily in the left chest and radiates down the left arm.  It feels sharp.  Endorses associated shortness of breath with chest pain and shortness of breath or exertional in nature.  Also states that his chest pain is worse with deep breathing.  Denies calf tenderness.  Denies history of COPD.  Patient states he is a smoker.  Smokes 5 cigars a day.  Also endorses cough and congestion but states that his not new.  States he has not used cocaine in the last 7 years.   Chest Pain      Home Medications Prior to Admission medications   Medication Sig Start Date End Date Taking? Authorizing Provider  ondansetron (ZOFRAN) 4 MG tablet Take 1 tablet (4 mg total) by mouth every 8 (eight) hours as needed for nausea or vomiting. 03/07/21   Tegeler, Canary Brim, MD  oxyCODONE-acetaminophen (PERCOCET) 10-325 MG tablet Take 1 tablet by mouth every 4 (four) hours as needed for pain. 08/28/20   Molpus, Chaun Uemura, MD      Allergies    Tramadol    Review of Systems   Review of Systems  Cardiovascular:  Positive for chest pain.     ED Results / Procedures / Treatments   Labs (all labs ordered are listed, but only abnormal results are displayed) Labs Reviewed  CBC - Abnormal; Notable for the following components:      Result Value   Hemoglobin 18.3 (*)    All other components within normal limits  RESP PANEL BY RT-PCR (RSV, FLU A&B, COVID)  RVPGX2  BASIC METABOLIC PANEL  TROPONIN I (HIGH SENSITIVITY)    EKG None  Radiology DG Chest Port 1 View  Result Date: 12/09/2022 CLINICAL DATA:  Left-sided chest pain for 2 days,  initial encounter EXAM: PORTABLE CHEST 1 VIEW COMPARISON:  04/24/2016 FINDINGS: The heart size and mediastinal contours are within normal limits. Both lungs are clear. The visualized skeletal structures are unremarkable. IMPRESSION: No active disease. Electronically Signed   By: Alcide Clever M.D.   On: 12/09/2022 22:13    Procedures Procedures  {Document cardiac monitor, telemetry assessment procedure when appropriate:1}  Medications Ordered in ED Medications  albuterol (PROVENTIL,VENTOLIN) solution continuous neb (has no administration in time range)  ipratropium (ATROVENT) nebulizer solution 0.5 mg (has no administration in time range)  magnesium sulfate IVPB 2 g 50 mL (has no administration in time range)  methylPREDNISolone sodium succinate (SOLU-MEDROL) 125 mg/2 mL injection 125 mg (has no administration in time range)    ED Course/ Medical Decision Making/ A&P   {   Click here for ABCD2, HEART and other calculatorsREFRESH Note before signing :1}                          Medical Decision Making Amount and/or Complexity of Data Reviewed Labs: ordered. Radiology: ordered.   Initial Impression and Ddx *** Patient PMH that increases complexity of ED encounter:  ***  Interpretation of Diagnostics I independent reviewed and interpreted the labs as followed: ***  - I independently visualized the following imaging  with scope of interpretation limited to determining acute life threatening conditions related to emergency care: ***, which revealed ***  Patient Reassessment and Ultimate Disposition/Management ***  Patient management required discussion with the following services or consulting groups:  {BEROCONSULT:26841}  Complexity of Problems Addressed {BEROCOPA:26833}  Additional Data Reviewed and Analyzed Further history obtained from: {BERODATA:26834}  Patient Encounter Risk Assessment {BERORISK:26838}   {Document critical care time when appropriate:1} {Document  review of labs and clinical decision tools ie heart score, Chads2Vasc2 etc:1}  {Document your independent review of radiology images, and any outside records:1} {Document your discussion with family members, caretakers, and with consultants:1} {Document social determinants of health affecting pt's care:1} {Document your decision making why or why not admission, treatments were needed:1} Final Clinical Impression(s) / ED Diagnoses Final diagnoses:  None    Rx / DC Orders ED Discharge Orders     None

## 2022-12-10 ENCOUNTER — Emergency Department (HOSPITAL_BASED_OUTPATIENT_CLINIC_OR_DEPARTMENT_OTHER): Payer: 59

## 2022-12-10 DIAGNOSIS — R079 Chest pain, unspecified: Secondary | ICD-10-CM | POA: Diagnosis not present

## 2022-12-10 LAB — TROPONIN I (HIGH SENSITIVITY): Troponin I (High Sensitivity): 5 ng/L (ref <18)

## 2022-12-10 MED ORDER — IOHEXOL 350 MG/ML SOLN
100.0000 mL | Freq: Once | INTRAVENOUS | Status: AC | PRN
  Administered 2022-12-10: 75 mL via INTRAVENOUS

## 2022-12-10 NOTE — ED Notes (Signed)
Reviewed AVS with patient, patient expressed understanding of directions, denies further questions at this time. 

## 2022-12-23 ENCOUNTER — Emergency Department (HOSPITAL_BASED_OUTPATIENT_CLINIC_OR_DEPARTMENT_OTHER)
Admission: EM | Admit: 2022-12-23 | Discharge: 2022-12-23 | Discharge status: Against Medical Advice | Payer: 59 | Attending: Emergency Medicine | Admitting: Emergency Medicine

## 2022-12-23 ENCOUNTER — Other Ambulatory Visit: Payer: Self-pay

## 2022-12-23 ENCOUNTER — Emergency Department (HOSPITAL_BASED_OUTPATIENT_CLINIC_OR_DEPARTMENT_OTHER): Payer: 59 | Admitting: Radiology

## 2022-12-23 ENCOUNTER — Encounter (HOSPITAL_BASED_OUTPATIENT_CLINIC_OR_DEPARTMENT_OTHER): Payer: Self-pay

## 2022-12-23 DIAGNOSIS — W28XXXA Contact with powered lawn mower, initial encounter: Secondary | ICD-10-CM | POA: Insufficient documentation

## 2022-12-23 DIAGNOSIS — S299XXA Unspecified injury of thorax, initial encounter: Secondary | ICD-10-CM | POA: Insufficient documentation

## 2022-12-23 DIAGNOSIS — Z5321 Procedure and treatment not carried out due to patient leaving prior to being seen by health care provider: Secondary | ICD-10-CM | POA: Diagnosis not present

## 2022-12-23 NOTE — ED Triage Notes (Signed)
Pt states handle of lawnmower flew back at him, injured left side ribs, complaining of pain to same, worse with inhale.

## 2022-12-23 NOTE — ED Notes (Signed)
Pt called to room, no answer x 2.  

## 2022-12-23 NOTE — ED Notes (Signed)
Pt still unable to be located.

## 2022-12-23 NOTE — ED Notes (Signed)
Pt called for room in lobby, no answer and pt not visualized.  Checked outside ED for pt also, pt not visualized

## 2022-12-23 NOTE — ED Notes (Signed)
Call for pt, no answer 

## 2023-01-31 ENCOUNTER — Encounter: Payer: Self-pay | Admitting: Family Medicine

## 2023-01-31 ENCOUNTER — Telehealth: Payer: Self-pay

## 2023-01-31 ENCOUNTER — Ambulatory Visit (INDEPENDENT_AMBULATORY_CARE_PROVIDER_SITE_OTHER): Payer: 59 | Admitting: Family Medicine

## 2023-01-31 VITALS — BP 112/72 | HR 74 | Temp 99.1°F | Ht 79.0 in | Wt 199.1 lb

## 2023-01-31 DIAGNOSIS — Z13220 Encounter for screening for lipoid disorders: Secondary | ICD-10-CM

## 2023-01-31 DIAGNOSIS — Z13228 Encounter for screening for other metabolic disorders: Secondary | ICD-10-CM

## 2023-01-31 DIAGNOSIS — R252 Cramp and spasm: Secondary | ICD-10-CM | POA: Diagnosis not present

## 2023-01-31 DIAGNOSIS — Z1159 Encounter for screening for other viral diseases: Secondary | ICD-10-CM | POA: Diagnosis not present

## 2023-01-31 DIAGNOSIS — F419 Anxiety disorder, unspecified: Secondary | ICD-10-CM

## 2023-01-31 DIAGNOSIS — Z1329 Encounter for screening for other suspected endocrine disorder: Secondary | ICD-10-CM

## 2023-01-31 DIAGNOSIS — Z130 Encounter for screening for diseases of the blood and blood-forming organs and certain disorders involving the immune mechanism: Secondary | ICD-10-CM

## 2023-01-31 DIAGNOSIS — R202 Paresthesia of skin: Secondary | ICD-10-CM

## 2023-01-31 DIAGNOSIS — Z7689 Persons encountering health services in other specified circumstances: Secondary | ICD-10-CM

## 2023-01-31 DIAGNOSIS — G4700 Insomnia, unspecified: Secondary | ICD-10-CM

## 2023-01-31 DIAGNOSIS — R200 Anesthesia of skin: Secondary | ICD-10-CM

## 2023-01-31 DIAGNOSIS — Z114 Encounter for screening for human immunodeficiency virus [HIV]: Secondary | ICD-10-CM

## 2023-01-31 DIAGNOSIS — Z1211 Encounter for screening for malignant neoplasm of colon: Secondary | ICD-10-CM

## 2023-01-31 DIAGNOSIS — F32A Depression, unspecified: Secondary | ICD-10-CM | POA: Diagnosis not present

## 2023-01-31 LAB — CBC WITH DIFFERENTIAL/PLATELET
Basophils Absolute: 0.1 10*3/uL (ref 0.0–0.1)
Basophils Relative: 1.2 % (ref 0.0–3.0)
Eosinophils Absolute: 0.1 10*3/uL (ref 0.0–0.7)
Eosinophils Relative: 1.3 % (ref 0.0–5.0)
HCT: 59.6 % — ABNORMAL HIGH (ref 39.0–52.0)
Hemoglobin: 20.1 g/dL (ref 13.0–17.0)
Lymphocytes Relative: 21.7 % (ref 12.0–46.0)
Lymphs Abs: 1.8 10*3/uL (ref 0.7–4.0)
MCHC: 33.7 g/dL (ref 30.0–36.0)
MCV: 98.2 fl (ref 78.0–100.0)
Monocytes Absolute: 0.7 10*3/uL (ref 0.1–1.0)
Monocytes Relative: 8.6 % (ref 3.0–12.0)
Neutro Abs: 5.5 10*3/uL (ref 1.4–7.7)
Neutrophils Relative %: 67.2 % (ref 43.0–77.0)
Platelets: 280 10*3/uL (ref 150.0–400.0)
RBC: 6.07 Mil/uL — ABNORMAL HIGH (ref 4.22–5.81)
RDW: 14.9 % (ref 11.5–15.5)
WBC: 8.2 10*3/uL (ref 4.0–10.5)

## 2023-01-31 LAB — LIPID PANEL
Cholesterol: 181 mg/dL (ref 0–200)
HDL: 53.7 mg/dL (ref >39.00)
LDL Cholesterol: 94 mg/dL (ref 0–99)
NonHDL: 127.36
Total CHOL/HDL Ratio: 3
Triglycerides: 169 mg/dL — ABNORMAL HIGH (ref 0.0–149.0)
VLDL: 33.8 mg/dL (ref 0.0–40.0)

## 2023-01-31 LAB — TSH: TSH: 5.64 u[IU]/mL — ABNORMAL HIGH (ref 0.35–5.50)

## 2023-01-31 LAB — COMPREHENSIVE METABOLIC PANEL
ALT: 36 U/L (ref 0–53)
AST: 42 U/L — ABNORMAL HIGH (ref 0–37)
Albumin: 4.2 g/dL (ref 3.5–5.2)
Alkaline Phosphatase: 39 U/L (ref 39–117)
BUN: 9 mg/dL (ref 6–23)
CO2: 24 mEq/L (ref 19–32)
Calcium: 9.6 mg/dL (ref 8.4–10.5)
Chloride: 102 mEq/L (ref 96–112)
Creatinine, Ser: 1.1 mg/dL (ref 0.40–1.50)
GFR: 75.98 mL/min (ref >60.00)
Glucose, Bld: 85 mg/dL (ref 70–99)
Potassium: 4 mEq/L (ref 3.5–5.1)
Sodium: 139 mEq/L (ref 135–145)
Total Bilirubin: 1.9 mg/dL — ABNORMAL HIGH (ref 0.2–1.2)
Total Protein: 6.9 g/dL (ref 6.0–8.3)

## 2023-01-31 LAB — HEMOGLOBIN A1C: Hgb A1c MFr Bld: 5.4 % (ref 4.6–6.5)

## 2023-01-31 LAB — MAGNESIUM: Magnesium: 1.5 mg/dL (ref 1.5–2.5)

## 2023-01-31 LAB — VITAMIN B12: Vitamin B-12: 308 pg/mL (ref 211–911)

## 2023-01-31 MED ORDER — HYDROXYZINE PAMOATE 25 MG PO CAPS
ORAL_CAPSULE | ORAL | 0 refills | Status: DC
Start: 2023-01-31 — End: 2023-03-20

## 2023-01-31 NOTE — Patient Instructions (Addendum)
-  It was a pleasure to meet you and look forward to taking care of you. -Ordered labs. Office will call with lab results and you may see them on MyChart.  -Placed a referral to GI for colonoscopy. -Placed a referral to psychiatry for medication management of depression and anxiety. In the meantime, START Hydroxyzine 25mg  tablet, take 1-2 tablets at bedtime for anxiety and insomnia.  -Follow up in 1 month.

## 2023-01-31 NOTE — Progress Notes (Signed)
New Patient Office Visit  Subjective    Patient ID: Billy White, male    DOB: 11/03/1965  Age: 57 y.o. MRN: [id number]  CC:  Chief Complaint  Patient presents with   Establish Care    Pt is here today to [facility]. Pt reports cramping feet at night- has hx of Rt foot broken. Pt is Fasting   HPI [name] presents to establish care with new provider.   Previous Primary Care Provider-[facility]-last seen in 2016.  Specialist-None   Patient reports he is complaining of cramping in both feet, more in the right foot, mostly at night, but some during the day. He has a history of closed displaced fracture of right calcaneus. (No follow up seen from ED visit) Reports numbness and tingling in both feet, but more in right. Started a couple of months ago. Denies any recent injury.   Anxiety and depression: Patient scored 13, very difficult on his PHQ-9 depression screening and he reports his anxiety is extremely elevated. He reports he has a strong family history of anxiety and some of his family members takes benzodiazepines for anxiety. He reports he has took some of their medication to help with his anxiety. Based on note review, patient has previously prescribed Paxil, Effexor, Citalopram, Xanax back in 2013. 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  Outpatient Encounter Medications as of 01/31/2023  Medication Sig   hydrOXYzine (VISTARIL) 25 MG capsule Take 1-2 tablets every night at bedtime as needed for anxiety and insomnia.   [DISCONTINUED] ondansetron (ZOFRAN) 4 MG tablet Take 1 tablet (4 mg total) by mouth every 8 (eight) hours as needed for nausea or vomiting.   [DISCONTINUED] oxyCODONE-acetaminophen (PERCOCET) 10-325 MG tablet Take 1 tablet by mouth every 4 (four) hours as needed for pain.   No facility-administered encounter medications on file as of 01/31/2023.    Past Medical  History:  Diagnosis Date   Chronic back pain    Depression    GERD (gastroesophageal reflux disease)    History of chicken pox     Past Surgical History:  Procedure Laterality Date   KNEE SURGERY     NOSE SURGERY      Family History  Problem Relation Age of Onset   Colon cancer Mother    Arthritis Father    Hypertension Father    COPD Paternal Grandmother     Social History   Socioeconomic History   Marital status: Divorced    Spouse name: Not on file   Number of children: 2   Years of education: Not on file   Highest education level: High school graduate  Occupational History   Occupation: self employed  Tobacco Use   Smoking status: Every Day    Types: Cigars   Smokeless tobacco: Never  Vaping Use   Vaping Use: Never used  Substance and Sexual Activity   Alcohol use: Yes    Comment:  1 beer daily   Drug use: Yes    Frequency: 7.0 times per week    Types: Marijuana    Comment: 3-4 times a week   Sexual activity: Yes  Other Topics Concern   Not on file  Social History Narrative   Not on file   Social Determinants of Health   Financial Resource Strain: Low Risk  (01/31/2023)   Overall Financial Resource Strain (CARDIA)    Difficulty of Paying Living  Expenses: Not very hard  Food Insecurity: No Food Insecurity (01/31/2023)   Hunger Vital Sign    Worried About Running Out of Food in the Last Year: Never true    Ran Out of Food in the Last Year: Never true  Transportation Needs: No Transportation Needs (01/31/2023)   PRAPARE - Administrator, Civil Service (Medical): No    Lack of Transportation (Non-Medical): No  Physical Activity: Sufficiently Active (01/31/2023)   Exercise Vital Sign    Days of Exercise per Week: 7 days    Minutes of Exercise per Session: 120 min  Stress: Stress Concern Present (01/31/2023)   [employer] of Occupational Health - Occupational Stress Questionnaire    Feeling of Stress : Very much  Social Connections:  Socially Isolated (01/31/2023)   Social Connection and Isolation Panel [NHANES]    Frequency of Communication with Friends and Family: More than three times a week    Frequency of Social Gatherings with Friends and Family: Once a week    Attends Religious Services: Never    Database administrator or Organizations: No    Attends Banker Meetings: Never    Marital Status: Divorced  Catering manager Violence: Not At Risk (01/31/2023)   Humiliation, Afraid, Rape, and Kick questionnaire    Fear of Current or Ex-Partner: No    Emotionally Abused: No    Physically Abused: No    Sexually Abused: No    ROS See HPI above    Objective   BP 112/72   Pulse 74   Temp 99.1 F (37.3 C)   Ht 6\' 7"  (2.007 m)   Wt 199 lb 2 oz (90.3 kg)   SpO2 96%   BMI 22.43 kg/m   Physical Exam Vitals reviewed.  Constitutional:      General: He is not in acute distress.    Appearance: Normal appearance. He is not ill-appearing, toxic-appearing or diaphoretic.  Eyes:     General:        Right eye: No discharge.        Left eye: No discharge.     Conjunctiva/sclera: Conjunctivae normal.  Cardiovascular:     Rate and Rhythm: Normal rate and regular rhythm.     Pulses:          Dorsalis pedis pulses are 2+ on the right side and 2+ on the left side.     Heart sounds: Normal heart sounds. No murmur heard.    No friction rub. No gallop.  Pulmonary:     Effort: Pulmonary effort is normal. No respiratory distress.     Breath sounds: Normal breath sounds.  Musculoskeletal:        General: Normal range of motion.     Right lower leg: No edema.     Left lower leg: No edema.  Skin:    General: Skin is warm and dry.  Neurological:     General: No focal deficit present.     Mental Status: He is alert and oriented to person, place, and time. Mental status is at baseline.  Psychiatric:        Mood and Affect: Mood is anxious and depressed.        Behavior: Behavior normal.        Thought Content:  Thought content normal.        Judgment: Judgment normal.      Assessment & Plan:  Depression, unspecified depression type -     Ambulatory referral  to Psychiatry  Insomnia, unspecified type -     hydrOXYzine Pamoate; Take 1-2 tablets every night at bedtime as needed for anxiety and insomnia.  Dispense: 60 capsule; Refill: 0  Anxiety -     Ambulatory referral to Psychiatry -     hydrOXYzine Pamoate; Take 1-2 tablets every night at bedtime as needed for anxiety and insomnia.  Dispense: 60 capsule; Refill: 0  Leg cramps -     Magnesium  Numbness and tingling -     Vitamin B12  Colon cancer screening -     Ambulatory referral to Gastroenterology  Lipid screening -     Lipid panel  Screening for endocrine, metabolic and immunity disorder -     CBC with Differential/Platelet -     Comprehensive metabolic panel -     TSH -     Hemoglobin A1c  Encounter for screening for HIV -     HIV Antibody (routine testing w rflx)  Need for hepatitis C screening test -     Hepatitis C antibody  Encounter to establish care  1.Review health maintenance: -Covid vaccine-declines vaccine -Ordered HIV and Hep C screening -Placed a referral to GI for colonoscopy. Family history of colon/rectal cancer.  -Shingles vaccine-hold, will make a nurse visit if he decides to get vaccine.  2.Ordered screening labs and labs for a reason of muscle cramps in his feet.  3.Referral to psychiatry for medication management of anxiety and depression.  4.Prescribed Hydroxyzine 25mg  tablet, 1-2 tablets at bedtime as needed for anxiety and depression until he can be further evaluated by psychiatry. 5.Will look at labs for a reason of feet cramps and numbness and tingling before prescribing any medication.  Return in about 1 month (around 03/02/2023) for follow-up.   Zandra Abts, NP

## 2023-01-31 NOTE — Telephone Encounter (Signed)
Clydie Braun from the lab called with a critical lab for this pt . She states pt's HgB  is 20.1

## 2023-01-31 NOTE — Telephone Encounter (Signed)
I left a message for pt call our office in regard to his lab and referral

## 2023-02-01 ENCOUNTER — Telehealth: Payer: Self-pay

## 2023-02-01 ENCOUNTER — Telehealth: Payer: Self-pay | Admitting: Family Medicine

## 2023-02-01 ENCOUNTER — Other Ambulatory Visit: Payer: Self-pay

## 2023-02-01 DIAGNOSIS — D582 Other hemoglobinopathies: Secondary | ICD-10-CM

## 2023-02-01 LAB — HEPATITIS C ANTIBODY: Hepatitis C Ab: NONREACTIVE

## 2023-02-01 LAB — HIV ANTIBODY (ROUTINE TESTING W REFLEX): HIV 1&2 Ab, 4th Generation: NONREACTIVE

## 2023-02-01 MED ORDER — GABAPENTIN 100 MG PO CAPS: 100.0000 mg | ORAL_CAPSULE | Freq: Every evening | ORAL | 1 refills | Status: DC

## 2023-02-01 MED ORDER — GABAPENTIN 100 MG PO CAPS: 100.0000 mg | ORAL_CAPSULE | Freq: Three times a day (TID) | ORAL | 3 refills | Status: DC

## 2023-02-01 NOTE — Telephone Encounter (Signed)
I have attempted to reach pt twice the first number the person stated they did not know this man and the second number is out of service . Will send a letter

## 2023-02-01 NOTE — Addendum Note (Signed)
Addended by: Zandra Abts R on: 02/01/2023 11:14 AM   Modules accepted: Orders

## 2023-02-01 NOTE — Progress Notes (Signed)
Left vm

## 2023-02-01 NOTE — Telephone Encounter (Signed)
This has been sent to the pharmacy by Mardene Celeste today

## 2023-02-01 NOTE — Telephone Encounter (Signed)
-----   Message from Alveria Apley, NP sent at 02/01/2023  8:33 AM EDT ----- Hemoglobin is elevated, recommend patient be referred to hematology. He has been elevated for the last 5 years and possibly need further workup. (Dx: Elevated hemoglobin and elevated hematocrit) Triglycerides are elevated, recommend to decrease carbohydrates, such as rice, potatoes, pastas, breads, and sweets.  Total bilirubin is elevated and AST is elevated, these both are related to liver, recommend an ultrasound of your liver. (Dx: Elevated liver enzymes) TSH is elevated, recommend to add T3 and T4 (Dx: Elevated TSH) All other labs are stable.  If you would like we can try Gabapentin 100mg  tablet, take 1 at bedtime to see if this helps with pain in your extremities. (Dx: Leg cramps, numbness/tingling)

## 2023-02-01 NOTE — Telephone Encounter (Signed)
-----   Message from Alveria Apley, NP sent at 02/01/2023 12:42 PM EDT ----- Hep C is non reactive.

## 2023-02-01 NOTE — Telephone Encounter (Signed)
Patient has called wanting to discuss his lab results with someone, please advise.   [phone] is the best number to reach him

## 2023-02-01 NOTE — Telephone Encounter (Signed)
Letter out . Phone numbers need to be updated

## 2023-02-01 NOTE — Telephone Encounter (Signed)
Encourage patient to contact the pharmacy for refills or they can request refills through [facility]   WHAT PHARMACY WOULD THEY LIKE THIS SENT TO:  [facility] [address], [place] - [address]   MEDICATION NAME & DOSE: gabapentin (NEURONTIN) 100 MG capsule   NOTES/COMMENTS FROM PATIENT: Pt has 2 different prescriptions (dose) for Gabapentin clarification needed      Front office please notify patient: It takes 48-72 hours to process rx refill requests Ask patient to call pharmacy to ensure rx is ready before heading there.

## 2023-02-02 ENCOUNTER — Other Ambulatory Visit: Payer: Self-pay | Admitting: Family Medicine

## 2023-02-02 ENCOUNTER — Other Ambulatory Visit: Payer: Self-pay

## 2023-02-02 DIAGNOSIS — R748 Abnormal levels of other serum enzymes: Secondary | ICD-10-CM

## 2023-02-02 DIAGNOSIS — R7989 Other specified abnormal findings of blood chemistry: Secondary | ICD-10-CM

## 2023-02-02 NOTE — Telephone Encounter (Signed)
Patient has called the office again to speak with the someone regarding labs The mobile number in chart or [phone] are the best ways to reach pt

## 2023-02-02 NOTE — Progress Notes (Signed)
Ordered RUQ limited ultrasound to assess liver with having elevated liver enzymes on previous labs.

## 2023-02-02 NOTE — Telephone Encounter (Signed)
Left vm for pt to call office 

## 2023-02-02 NOTE — Telephone Encounter (Signed)
Pt is aware.  

## 2023-02-05 ENCOUNTER — Telehealth: Payer: Self-pay

## 2023-02-05 ENCOUNTER — Other Ambulatory Visit (INDEPENDENT_AMBULATORY_CARE_PROVIDER_SITE_OTHER): Payer: 59

## 2023-02-05 ENCOUNTER — Other Ambulatory Visit: Payer: Self-pay

## 2023-02-05 ENCOUNTER — Ambulatory Visit (HOSPITAL_BASED_OUTPATIENT_CLINIC_OR_DEPARTMENT_OTHER)
Admission: RE | Admit: 2023-02-05 | Discharge: 2023-02-05 | Disposition: A | Discharge status: Home / Self Care | Payer: 59 | Source: Ambulatory Visit | Attending: Family Medicine | Admitting: Family Medicine

## 2023-02-05 DIAGNOSIS — R7989 Other specified abnormal findings of blood chemistry: Secondary | ICD-10-CM | POA: Diagnosis not present

## 2023-02-05 DIAGNOSIS — R748 Abnormal levels of other serum enzymes: Secondary | ICD-10-CM | POA: Insufficient documentation

## 2023-02-05 DIAGNOSIS — K760 Fatty (change of) liver, not elsewhere classified: Secondary | ICD-10-CM | POA: Diagnosis not present

## 2023-02-05 DIAGNOSIS — R945 Abnormal results of liver function studies: Secondary | ICD-10-CM | POA: Diagnosis not present

## 2023-02-05 LAB — T3, FREE: T3, Free: 2.6 pg/mL (ref 2.3–4.2)

## 2023-02-05 LAB — T4, FREE: Free T4: 0.74 ng/dL (ref 0.60–1.60)

## 2023-02-05 NOTE — Telephone Encounter (Signed)
-----   Message from Alveria Apley, NP sent at 02/05/2023 12:55 PM EDT ----- T3 and T4 are normal. Recommend to recheck TSH with T3 and T4 in 6 weeks.

## 2023-02-06 ENCOUNTER — Other Ambulatory Visit: Payer: Self-pay

## 2023-02-06 ENCOUNTER — Other Ambulatory Visit: Payer: Self-pay | Admitting: Family Medicine

## 2023-02-06 ENCOUNTER — Telehealth: Payer: Self-pay

## 2023-02-06 DIAGNOSIS — K760 Fatty (change of) liver, not elsewhere classified: Secondary | ICD-10-CM

## 2023-02-06 NOTE — Telephone Encounter (Signed)
Referral was placed 

## 2023-02-06 NOTE — Telephone Encounter (Signed)
-----   Message from Alveria Apley, NP sent at 02/06/2023 11:45 AM EDT ----- Fatty liver noted on ultrasound, otherwise every thing else looks good. Recommend a referral to a hepatologist, liver specialist.

## 2023-02-06 NOTE — Progress Notes (Signed)
Patient agreed to hepatology referral due to fatty liver found on ultrasound.

## 2023-02-13 ENCOUNTER — Emergency Department (HOSPITAL_BASED_OUTPATIENT_CLINIC_OR_DEPARTMENT_OTHER)
Admission: EM | Admit: 2023-02-13 | Discharge: 2023-02-13 | Disposition: A | Discharge status: Home / Self Care | Payer: 59 | Attending: Emergency Medicine | Admitting: Emergency Medicine

## 2023-02-13 ENCOUNTER — Telehealth: Payer: Self-pay | Admitting: Family Medicine

## 2023-02-13 ENCOUNTER — Emergency Department (HOSPITAL_BASED_OUTPATIENT_CLINIC_OR_DEPARTMENT_OTHER): Payer: 59 | Admitting: Radiology

## 2023-02-13 ENCOUNTER — Other Ambulatory Visit: Payer: Self-pay

## 2023-02-13 DIAGNOSIS — M79602 Pain in left arm: Secondary | ICD-10-CM | POA: Diagnosis not present

## 2023-02-13 DIAGNOSIS — R197 Diarrhea, unspecified: Secondary | ICD-10-CM | POA: Diagnosis not present

## 2023-02-13 DIAGNOSIS — R252 Cramp and spasm: Secondary | ICD-10-CM | POA: Diagnosis not present

## 2023-02-13 DIAGNOSIS — E876 Hypokalemia: Secondary | ICD-10-CM | POA: Insufficient documentation

## 2023-02-13 DIAGNOSIS — R0789 Other chest pain: Secondary | ICD-10-CM | POA: Diagnosis not present

## 2023-02-13 LAB — BASIC METABOLIC PANEL
Anion gap: 16 — ABNORMAL HIGH (ref 5–15)
Anion gap: 9 (ref 5–15)
BUN: 10 mg/dL (ref 6–20)
BUN: 9 mg/dL (ref 6–20)
CO2: 19 mmol/L — ABNORMAL LOW (ref 22–32)
CO2: 24 mmol/L (ref 22–32)
Calcium: 9.6 mg/dL (ref 8.9–10.3)
Calcium: 8.9 mg/dL (ref 8.9–10.3)
Chloride: 101 mmol/L (ref 98–111)
Chloride: 107 mmol/L (ref 98–111)
Creatinine, Ser: 1.09 mg/dL (ref 0.61–1.24)
Creatinine, Ser: 1.05 mg/dL (ref 0.61–1.24)
GFR, Estimated: >60 mL/min (ref >60)
GFR, Estimated: >60 mL/min (ref >60)
Glucose, Bld: 208 mg/dL — ABNORMAL HIGH (ref 70–99)
Glucose, Bld: 61 mg/dL — ABNORMAL LOW (ref 70–99)
Potassium: 3.3 mmol/L — ABNORMAL LOW (ref 3.5–5.1)
Potassium: 3.8 mmol/L (ref 3.5–5.1)
Sodium: 136 mmol/L (ref 135–145)
Sodium: 140 mmol/L (ref 135–145)

## 2023-02-13 LAB — CBC
HCT: 49.5 % (ref 39.0–52.0)
Hemoglobin: 17.4 g/dL — ABNORMAL HIGH (ref 13.0–17.0)
MCH: 33.5 pg (ref 26.0–34.0)
MCHC: 35.2 g/dL (ref 30.0–36.0)
MCV: 95.2 fL (ref 80.0–100.0)
Platelets: 235 10*3/uL (ref 150–400)
RBC: 5.2 MIL/uL (ref 4.22–5.81)
RDW: 13.5 % (ref 11.5–15.5)
WBC: 8.1 10*3/uL (ref 4.0–10.5)
nRBC: 0 % (ref 0.0–0.2)

## 2023-02-13 LAB — I-STAT VENOUS BLOOD GAS, ED
Acid-base deficit: 4 mmol/L — ABNORMAL HIGH (ref 0.0–2.0)
Bicarbonate: 21.6 mmol/L (ref 20.0–28.0)
Calcium, Ion: 1.17 mmol/L (ref 1.15–1.40)
HCT: 46 % (ref 39.0–52.0)
Hemoglobin: 15.6 g/dL (ref 13.0–17.0)
O2 Saturation: 83 %
Patient temperature: 98.7
Potassium: 3.8 mmol/L (ref 3.5–5.1)
Sodium: 137 mmol/L (ref 135–145)
TCO2: 23 mmol/L (ref 22–32)
pCO2, Ven: 39.3 mmHg — ABNORMAL LOW (ref 44–60)
pH, Ven: 7.349 (ref 7.25–7.43)
pO2, Ven: 50 mmHg — ABNORMAL HIGH (ref 32–45)

## 2023-02-13 LAB — MAGNESIUM
Magnesium: 1.3 mg/dL — ABNORMAL LOW (ref 1.7–2.4)
Magnesium: 1.7 mg/dL (ref 1.7–2.4)

## 2023-02-13 LAB — BETA-HYDROXYBUTYRIC ACID: Beta-Hydroxybutyric Acid: 0.09 mmol/L (ref 0.05–0.27)

## 2023-02-13 LAB — TROPONIN I (HIGH SENSITIVITY)
Troponin I (High Sensitivity): 3 ng/L (ref <18)
Troponin I (High Sensitivity): 3 ng/L (ref <18)

## 2023-02-13 LAB — PHOSPHORUS: Phosphorus: 1.9 mg/dL — ABNORMAL LOW (ref 2.5–4.6)

## 2023-02-13 MED ORDER — KETOROLAC TROMETHAMINE 15 MG/ML IJ SOLN
15.0000 mg | Freq: Once | INTRAMUSCULAR | Status: AC
  Administered 2023-02-13: 15 mg via INTRAVENOUS
  Filled 2023-02-13: qty 1

## 2023-02-13 MED ORDER — POTASSIUM CHLORIDE 20 MEQ PO PACK
40.0000 meq | PACK | Freq: Every day | ORAL | Status: DC
  Administered 2023-02-13: 40 meq via ORAL
  Filled 2023-02-13: qty 2

## 2023-02-13 MED ORDER — SODIUM CHLORIDE 0.9 % IV BOLUS
1000.0000 mL | Freq: Once | INTRAVENOUS | Status: AC
  Administered 2023-02-13: 1000 mL via INTRAVENOUS

## 2023-02-13 MED ORDER — MAGNESIUM SULFATE 50 % IJ SOLN
1.0000 g | Freq: Once | INTRAMUSCULAR | Status: AC
  Administered 2023-02-13: 1 g via INTRAVENOUS
  Filled 2023-02-13: qty 2

## 2023-02-13 MED ORDER — POTASSIUM CHLORIDE 10 MEQ/100ML IV SOLN
10.0000 meq | Freq: Once | INTRAVENOUS | Status: AC
  Administered 2023-02-13: 10 meq via INTRAVENOUS
  Filled 2023-02-13: qty 100

## 2023-02-13 NOTE — Telephone Encounter (Signed)
Caller name: MACKIE FIERS  On DPR?: Yes  Call back number: [phone] (mobile)  Provider they see: Alveria Apley, NP  Reason for call:  Pt states Gabapentin not working. Leg cramps not sure which to due make appt or go to ED.

## 2023-02-13 NOTE — ED Triage Notes (Signed)
A+Ox4. Ambulatory to triage. Slow moving.  Leg cramps x2- takes gabapentin- no relief. Complains of left arm pain as well since this AM.

## 2023-02-13 NOTE — ED Provider Notes (Signed)
[facility] AT [facility] Provider Note   CSN: [id number] Arrival date & time: 02/13/23  1329     History  Chief Complaint  Patient presents with   Chest Pain   Leg Cramping    Billy White is a 57 y.o. male who is presenting with bilateral leg cramping that has been ongoing for a couple months.  He was given gabapentin by his primary care provider and reports this has not helped with his cramping.  Cramping occurs in the legs from his feet up to his thighs for 2 to 3 hours at a time.  It seems to have gotten worse over the past couple of days and is currently having 10/10 pain. He also reports diarrhea for the past 5 days also diarrhea is mostly watery, no blood in his diarrhea.  He denies being out in the heat or doing any strenuous activities.  Denies cramping anywhere else.  Also noted a shooting pain in his left arm this morning but denies any more episodes of this. Denies any shortness of breath, chest pain, abdominal pain, jaw pain.  Has chronic cough due to smoking, but denies any changes to his coughing, fever, chills, sore throat, sick contacts.  Denies any recent travel or camping.  Denies feeling more thirsty than normal or going to the bathroom more than normal.  Denies any history of diabetes.    Chest Pain      Home Medications Prior to Admission medications   Medication Sig Start Date End Date Taking? Authorizing Provider  gabapentin (NEURONTIN) 100 MG capsule Take 1 capsule (100 mg total) by mouth at bedtime. 02/01/23   Alveria Apley, NP  hydrOXYzine (VISTARIL) 25 MG capsule Take 1-2 tablets every night at bedtime as needed for anxiety and insomnia. 01/31/23   Alveria Apley, NP      Allergies    Tramadol    Review of Systems   Review of Systems  Cardiovascular:  Positive for chest pain.  Gastrointestinal:  Positive for diarrhea.  Musculoskeletal:        Leg cramping    Physical Exam Updated Vital Signs BP (!)  164/87   Pulse 75   Temp 99.1 F (37.3 C) (Oral)   Resp 16   Ht 6\' 7"  (2.007 m)   Wt 90.7 kg   SpO2 93%   BMI 22.53 kg/m  Physical Exam Vitals and nursing note reviewed.  Constitutional:      General: He is not in acute distress.    Appearance: He is well-developed.  Eyes:     Conjunctiva/sclera: Conjunctivae normal.  Cardiovascular:     Rate and Rhythm: Normal rate and regular rhythm.     Heart sounds: Normal heart sounds. No murmur heard. Pulmonary:     Effort: Pulmonary effort is normal. No respiratory distress.     Comments: Diffuse wheezing  Abdominal:     General: Abdomen is flat. Bowel sounds are normal.     Palpations: Abdomen is soft.     Tenderness: There is no abdominal tenderness.  Musculoskeletal:        General: No swelling.     Right lower leg: No edema.     Left lower leg: No edema.     Comments: No calf swelling or tenderness to palpation bilaterally No obvious deformity of the left shoulder, no tenderness to palpation  Skin:    General: Skin is warm and dry.     Capillary Refill: Capillary refill takes  less than 2 seconds.     Findings: No rash.  Neurological:     General: No focal deficit present.     Mental Status: He is alert.  Psychiatric:        Mood and Affect: Mood normal.     ED Results / Procedures / Treatments   Labs (all labs ordered are listed, but only abnormal results are displayed) Labs Reviewed  BASIC METABOLIC PANEL - Abnormal; Notable for the following components:      Result Value   Potassium 3.3 (*)    CO2 19 (*)    Glucose, Bld 208 (*)    Anion gap 16 (*)    All other components within normal limits  CBC - Abnormal; Notable for the following components:   Hemoglobin 17.4 (*)    All other components within normal limits  PHOSPHORUS - Abnormal; Notable for the following components:   Phosphorus 1.9 (*)    All other components within normal limits  MAGNESIUM - Abnormal; Notable for the following components:   Magnesium  1.3 (*)    All other components within normal limits  BASIC METABOLIC PANEL - Abnormal; Notable for the following components:   Glucose, Bld 61 (*)    All other components within normal limits  I-STAT VENOUS BLOOD GAS, ED - Abnormal; Notable for the following components:   pCO2, Ven 39.3 (*)    pO2, Ven 50 (*)    Acid-base deficit 4.0 (*)    All other components within normal limits  BETA-HYDROXYBUTYRIC ACID  MAGNESIUM  TROPONIN I (HIGH SENSITIVITY)  TROPONIN I (HIGH SENSITIVITY)    EKG None  Radiology DG Chest 2 View  Result Date: 02/13/2023 CLINICAL DATA:  Left arm pain EXAM: CHEST - 2 VIEW COMPARISON:  12/09/2022 FINDINGS: Lungs are clear.  No pneumothorax. Heart size and mediastinal contours are within normal limits. No effusion. Visualized bones unremarkable. IMPRESSION: No acute cardiopulmonary disease. Electronically Signed   By: Corlis Leak M.D.   On: 02/13/2023 15:18    Procedures Procedures    Medications Ordered in ED Medications  potassium chloride (KLOR-CON) packet 40 mEq (40 mEq Oral Given 02/13/23 1554)  potassium chloride 10 mEq in 100 mL IVPB (0 mEq Intravenous Stopped 02/13/23 1708)  sodium chloride 0.9 % bolus 1,000 mL (0 mLs Intravenous Stopped 02/13/23 1708)  ketorolac (TORADOL) 15 MG/ML injection 15 mg (15 mg Intravenous Given 02/13/23 1632)  magnesium sulfate (IV Push/IM) injection 1 g (1 g Intravenous Given 02/13/23 1818)  sodium chloride 0.9 % bolus 1,000 mL (0 mLs Intravenous Stopped 02/13/23 1928)    ED Course/ Medical Decision Making/ A&P                             Medical Decision Making Amount and/or Complexity of Data Reviewed Labs: ordered. Radiology: ordered.  Risk Prescription drug management.   57 y.o. male presents to the ED for concern of bilateral leg cramping and left shoulder pain  Differential diagnosis includes but is not limited to electrolyte abnormality, dehydration, DVT, ACS, shoulder injury, musculoskeletal shoulder pain,  radiculopathy, DKA.  ED Course:  Patient well-appearing with normal vital signs.  ECG without concern for STEMI and troponin of 3.  Awaiting repeat troponin, although given no chest pain, shortness of breath, vital signs instability, normal ECGs and troponin, currently any ACS pathology less likely.  Suspect shoulder pain may be due to electrolyte abnormality, musculoskeletal or radiculopathy.  Patient denies any  calf pain, no calf swelling or tenderness on exam, cramping is bilateral, less concern for DVT. Patient endorses 5 days of diarrhea and now with hypokalemia of 3.3, will give IV and oral repletion along with fluids.  Suspect this may be causing his cramping.  Due to new onset diarrhea now with elevated glucose of 208 and anion gap of 16, CO2 of 19, will obtain beta hydroxybutyrate, VBG, Mg, phosphorus to further evaluate for possible DKA 6:10 PM magnesium reported low at 1.3, phosphorus low at 1.9. Will replete magnesium, holding on phosphorus repletion now. Another bolus of NS given. Patient updated and still appears well. Upon re-evaluation, patient reports no more pain and cramping has subsided.  Repeat labs show potassium now up to 3.8, anion gap closed at 9, CO2 now normal at 24.  Beta hydroxybutyrate within normal limits.  No concern for DKA at this time.  Feel patient can discharge at this time with strict return precautions.   Impression: Hypokalemia Hypophosphatemia Hypomagnesemia  Leg Cramping Diarrhea  Disposition:  The patient was discharged home with instructions to continue plenty of potassium and magnesium rich foods and to keep hydrated. Instructed to follow up with his PCP if his diarrhea does not begin to improve within the next 3 days. Strict return precautions given.    Lab Tests: I Ordered, and personally interpreted labs.  The pertinent results include:   BMP with potassium of 3.3, glucose of 208, anion gap of 16, CO2 low at 19 CBC with elevated hemoglobin at  17.4, patient has had similar values in the past Phosphorus low at 1.9, magnesium low at 1.3 Beta hydroxybutyrate within normal limits Initial troponin of 3, with repeat troponin the same at 3  Imaging Studies ordered: I ordered imaging studies including chest x-ray I independently visualized the imaging with scope of interpretation limited to determining acute life threatening conditions related to emergency care. Imaging showed no concern for infiltrates, pneumothorax, pleural effusion I agree with the radiologist interpretation   Cardiac Monitoring: / EKG: The patient was maintained on a cardiac monitor.  I personally viewed and interpreted the EKG monitored which showed no signs of ST elevation or depression   Consultations Obtained: None  External records from outside source obtained and reviewed including hemoglobin A1c from 6/19 5.4   Co morbidities that complicate the patient evaluation  Smoker  Social Determinants of Health:  Socially isolated, tobacco use, stress concerns present              Final Clinical Impression(s) / ED Diagnoses Final diagnoses:  Hypomagnesemia  Hypokalemia  Hypophosphatemia  Diarrhea, unspecified type    Rx / DC Orders ED Discharge Orders     None         Arabella Merles, Cordelia Poche 02/13/23 2118    Glyn Ade, MD 02/14/23 1511

## 2023-02-13 NOTE — ED Notes (Signed)
Ambulatory to restroom with steady gait.

## 2023-02-13 NOTE — ED Notes (Signed)
Pt given Cola and peanut butter crackers

## 2023-02-13 NOTE — Telephone Encounter (Signed)
Pt called and he states his leg cramps are worse . He states the Gabapentin is no helping . He has tried mustard and etc nothing  he would like to know should he schedule an apt or go to ER ?

## 2023-02-13 NOTE — Telephone Encounter (Signed)
Noted  

## 2023-02-13 NOTE — Discharge Instructions (Addendum)
Please continue plenty of potassium and magnesium rich foods such as bananas, avocado, black beans, spinach. Also keep hydrated with water or electrolyte drinks. Your potassium, magnesium, phosphorus levels were low today, which may be due to your diarrhea.  If your diarrhea does not improve within the next 3 days, please follow up with your PCP. You can also be re-evaluated at the ER or an urgent care.  Return to the ER if you have severe cramping, dizziness, vomiting, abdominal pain, or any other concerning symptoms.

## 2023-02-14 ENCOUNTER — Telehealth: Payer: Self-pay | Admitting: Internal Medicine

## 2023-02-14 NOTE — Telephone Encounter (Signed)
Contacted patient to scheduled appointments. Patient is aware of appointments that are scheduled.   

## 2023-02-19 ENCOUNTER — Emergency Department (HOSPITAL_BASED_OUTPATIENT_CLINIC_OR_DEPARTMENT_OTHER)
Admission: EM | Admit: 2023-02-19 | Discharge: 2023-02-20 | Disposition: A | Discharge status: Home / Self Care | Payer: 59 | Attending: Emergency Medicine | Admitting: Emergency Medicine

## 2023-02-19 ENCOUNTER — Encounter (HOSPITAL_BASED_OUTPATIENT_CLINIC_OR_DEPARTMENT_OTHER): Payer: Self-pay

## 2023-02-19 ENCOUNTER — Other Ambulatory Visit: Payer: Self-pay

## 2023-02-19 DIAGNOSIS — R252 Cramp and spasm: Secondary | ICD-10-CM | POA: Diagnosis not present

## 2023-02-19 DIAGNOSIS — M79604 Pain in right leg: Secondary | ICD-10-CM | POA: Insufficient documentation

## 2023-02-19 DIAGNOSIS — M79605 Pain in left leg: Secondary | ICD-10-CM | POA: Diagnosis not present

## 2023-02-19 LAB — CBC WITH DIFFERENTIAL/PLATELET
Abs Immature Granulocytes: 0.02 10*3/uL (ref 0.00–0.07)
Basophils Absolute: 0.1 10*3/uL (ref 0.0–0.1)
Basophils Relative: 1 %
Eosinophils Absolute: 0.2 10*3/uL (ref 0.0–0.5)
Eosinophils Relative: 2 %
HCT: 47.3 % (ref 39.0–52.0)
Hemoglobin: 16.5 g/dL (ref 13.0–17.0)
Immature Granulocytes: 0 %
Lymphocytes Relative: 43 %
Lymphs Abs: 2.8 10*3/uL (ref 0.7–4.0)
MCH: 33.6 pg (ref 26.0–34.0)
MCHC: 34.9 g/dL (ref 30.0–36.0)
MCV: 96.3 fL (ref 80.0–100.0)
Monocytes Absolute: 0.6 10*3/uL (ref 0.1–1.0)
Monocytes Relative: 9 %
Neutro Abs: 3 10*3/uL (ref 1.7–7.7)
Neutrophils Relative %: 45 %
Platelets: 187 10*3/uL (ref 150–400)
RBC: 4.91 MIL/uL (ref 4.22–5.81)
RDW: 13.5 % (ref 11.5–15.5)
WBC: 6.6 10*3/uL (ref 4.0–10.5)
nRBC: 0 % (ref 0.0–0.2)

## 2023-02-19 LAB — BASIC METABOLIC PANEL
Anion gap: 11 (ref 5–15)
BUN: 11 mg/dL (ref 6–20)
CO2: 23 mmol/L (ref 22–32)
Calcium: 9.6 mg/dL (ref 8.9–10.3)
Chloride: 106 mmol/L (ref 98–111)
Creatinine, Ser: 0.94 mg/dL (ref 0.61–1.24)
GFR, Estimated: >60 mL/min (ref >60)
Glucose, Bld: 85 mg/dL (ref 70–99)
Potassium: 4 mmol/L (ref 3.5–5.1)
Sodium: 140 mmol/L (ref 135–145)

## 2023-02-19 LAB — MAGNESIUM: Magnesium: 1.5 mg/dL — ABNORMAL LOW (ref 1.7–2.4)

## 2023-02-19 LAB — CK: Total CK: 106 U/L (ref 49–397)

## 2023-02-19 NOTE — ED Provider Notes (Signed)
[place] EMERGENCY DEPARTMENT AT [facility] Provider Note   CSN: [id number] Arrival date & time: 02/19/23  1949     History  Chief Complaint  Patient presents with   Leg Pain    Billy White is a 57 y.o. male.  Patient presents to the emergency department tonight for ongoing leg pain and cramps.  Patient has a history of tobacco abuse, daily alcohol use, he donates plasma regularly as well.  The symptoms have been going on for months.  He established care with PCP on 01/31/2023.  He had labs drawn which showed polycythemia and also right upper quadrant ultrasound which was concerning for fatty liver disease.  He has been referred to hematology oncology as well as GI.  He was prescribed gabapentin to try which he has been taking without much improvement.  He was seen in the emergency department on 7/2 for same symptoms.  He was found to have low potassium, low magnesium and low phosphorus.  These were repleted.    Patient describes continuing cramps in his legs from his toes up to his thighs.  Symptoms actually get better when he is active and walking around more.  Symptoms are worse at night.  No distal numbness or tingling reported.  No history of peripheral vascular disease.  Denies injuries.  Otherwise no fevers, nausea, vomiting, urinary symptoms.  Patient denies risk factors for DVT/pulmonary embolism including: unilateral leg swelling, history of DVT/PE/other blood clots, use of exogenous hormones, recent immobilizations, recent surgery, recent travel (>4hr segment), malignancy, hemoptysis.          Home Medications Prior to Admission medications   Medication Sig Start Date End Date Taking? Authorizing Provider  gabapentin (NEURONTIN) 100 MG capsule Take 1 capsule (100 mg total) by mouth at bedtime. 02/01/23   Alveria Apley, NP  hydrOXYzine (VISTARIL) 25 MG capsule Take 1-2 tablets every night at bedtime as needed for anxiety and insomnia. 01/31/23    Alveria Apley, NP      Allergies    Tramadol    Review of Systems   Review of Systems  Physical Exam Updated Vital Signs BP (!) 119/93   Pulse 60   Temp 98.3 F (36.8 C) (Oral)   Resp 18   Ht 6\' 7"  (2.007 m)   Wt 90.7 kg   SpO2 92%   BMI 22.53 kg/m   Physical Exam Vitals and nursing note reviewed.  Constitutional:      Appearance: He is well-developed. He is not diaphoretic.  HENT:     Head: Normocephalic and atraumatic.     Mouth/Throat:     Mouth: Mucous membranes are not dry.  Eyes:     Conjunctiva/sclera: Conjunctivae normal.  Neck:     Vascular: Normal carotid pulses. No JVD.     Trachea: Trachea normal. No tracheal deviation.  Cardiovascular:     Rate and Rhythm: Normal rate and regular rhythm.     Pulses: No decreased pulses.          Radial pulses are 2+ on the right side and 2+ on the left side.       Dorsalis pedis pulses are 2+ on the right side and 2+ on the left side.       Posterior tibial pulses are 2+ on the right side and 2+ on the left side.     Heart sounds: Normal heart sounds, S1 normal and S2 normal. Heart sounds not distant. No murmur heard. Pulmonary:  Effort: Pulmonary effort is normal. No respiratory distress.     Breath sounds: Normal breath sounds. No wheezing.  Chest:     Chest wall: No tenderness.  Abdominal:     General: Bowel sounds are normal.     Palpations: Abdomen is soft.     Tenderness: There is no abdominal tenderness. There is no guarding or rebound.  Musculoskeletal:     Cervical back: Normal range of motion and neck supple. No muscular tenderness.     Right lower leg: No edema.     Left lower leg: No edema.     Comments: No tenderness on the calf or thigh bilaterally.  No obvious edema.  Skin:    General: Skin is warm and dry.     Coloration: Skin is not pale.  Neurological:     Mental Status: He is alert. Mental status is at baseline.  Psychiatric:        Mood and Affect: Mood normal.     ED Results  / Procedures / Treatments   Labs (all labs ordered are listed, but only abnormal results are displayed) Labs Reviewed  MAGNESIUM - Abnormal; Notable for the following components:      Result Value   Magnesium 1.5 (*)    All other components within normal limits  CBC WITH DIFFERENTIAL/PLATELET  BASIC METABOLIC PANEL  CK    EKG None  Radiology No results found.  Procedures Procedures    Medications Ordered in ED Medications  oxyCODONE-acetaminophen (PERCOCET/ROXICET) 5-325 MG per tablet 1 tablet (has no administration in time range)  magnesium oxide (MAG-OX) tablet 400 mg (has no administration in time range)    ED Course/ Medical Decision Making/ A&P    Patient seen and examined. History obtained directly from patient.  I reviewed previous outpatient PCP notes and ED notes as well as workup and laboratory results.  Labs/EKG: Ordered CBC, BMP, magnesium, CK.  Imaging: None ordered  Medications/Fluids: None ordered  Most recent vital signs reviewed and are as follows: BP (!) 119/93   Pulse 60   Temp 98.3 F (36.8 C) (Oral)   Resp 18   Ht 6\' 7"  (2.007 m)   Wt 90.7 kg   SpO2 92%   BMI 22.53 kg/m   Initial impression: Leg cramps in setting of recent hypomagnesemia, hypokalemia, hypophosphatemia.   12:21 AM Reassessment performed. Patient appears well, comfortable.  Patient's family member now at bedside.  We discussed results.  Labs personally reviewed and interpreted including: CBC unremarkable; BMP unremarkable; magnesium slightly low; CK normal.  Reviewed pertinent lab work and imaging with patient at bedside. Questions answered.  We had a good discussion regarding upcoming follow-up, especially with hematology oncology.  I told him that his hemoglobin continues to be improved and discussed that smoking and plasma donation could make this value worse.  Most current vital signs reviewed and are as follows: BP (!) 119/93   Pulse 60   Temp 98.3 F (36.8 C)  (Oral)   Resp 18   Ht 6\' 7"  (2.007 m)   Wt 90.7 kg   SpO2 92%   BMI 22.53 kg/m   Plan: Discharge to home.  Prior to discharge, will give oral magnesium supplement and 1 dose of oral Percocet.  Prescriptions written for: Magnesium supplement  Other home care instructions discussed: Maintain good hydration, try cutting back on smoking.  ED return instructions discussed: Return with uncontrolled pain, new symptoms or other concerns  Follow-up instructions discussed: Patient encouraged to follow-up with  their PCP in 7 days.                             Medical Decision Making Amount and/or Complexity of Data Reviewed Labs: ordered.   Patient with leg cramping for several months.  Recent electrolyte abnormalities as noted, only minimally low magnesium tonight which will be repleted.  Symptoms are not really consistent with claudication as they are improved with activity.  He has good pedal pulses.  No clinical signs of DVT.  Low concern for radiculopathy.  No indication for further workup or admission tonight.  Patient recently establish care with PCP who will continue workup.  The patient's vital signs, pertinent lab work and imaging were reviewed and interpreted as discussed in the ED course. Hospitalization was considered for further testing, treatments, or serial exams/observation. However as patient is well-appearing, has a stable exam, and reassuring studies today, I do not feel that they warrant admission at this time. This plan was discussed with the patient who verbalizes agreement and comfort with this plan and seems reliable and able to return to the Emergency Department with worsening or changing symptoms.           Final Clinical Impression(s) / ED Diagnoses Final diagnoses:  Leg cramping  Hypomagnesemia    Rx / DC Orders ED Discharge Orders          Ordered    magnesium oxide (MAG-OX) 400 MG tablet  Daily        02/20/23 0017              Renne Crigler, PA-C 02/20/23 0024    Vanetta Mulders, MD 02/22/23 2250

## 2023-02-19 NOTE — ED Provider Notes (Incomplete)
[place] EMERGENCY DEPARTMENT AT [facility] Provider Note   CSN: [id number] Arrival date & time: 02/19/23  1949     History {Add pertinent medical, surgical, social history, OB history to HPI:1} Chief Complaint  Patient presents with  . Leg Pain    Billy White is a 57 y.o. male.  Patient presents to the emergency department tonight for ongoing leg pain and cramps.  Patient has a history of tobacco abuse, daily alcohol use, he donates plasma regularly as well.  The symptoms have been going on for months.  He established care with PCP on 01/31/2023.  He had labs drawn which showed polycythemia and also right upper quadrant ultrasound which was concerning for fatty liver disease.  He has been referred to hematology oncology as well as GI.  He was prescribed gabapentin to try which he has been taking without much improvement.  He was seen in the emergency department on 7/2 for same symptoms.  He was found to have low potassium, low magnesium and low phosphorus.  These were repleted.    Patient describes continuing cramps in his legs from his toes up to his thighs.  Symptoms actually get better when he is active and walking around more.  Symptoms are worse at night.  No distal numbness or tingling reported.  No history of peripheral vascular disease.  Denies injuries.  Otherwise no fevers, nausea, vomiting, urinary symptoms.  Patient denies risk factors for DVT/pulmonary embolism including: unilateral leg swelling, history of DVT/PE/other blood clots, use of exogenous hormones, recent immobilizations, recent surgery, recent travel (>4hr segment), malignancy, hemoptysis.          Home Medications Prior to Admission medications   Medication Sig Start Date End Date Taking? Authorizing Provider  gabapentin (NEURONTIN) 100 MG capsule Take 1 capsule (100 mg total) by mouth at bedtime. 02/01/23   Alveria Apley, NP  hydrOXYzine (VISTARIL) 25 MG capsule Take 1-2 tablets every  night at bedtime as needed for anxiety and insomnia. 01/31/23   Alveria Apley, NP      Allergies    Tramadol    Review of Systems   Review of Systems  Physical Exam Updated Vital Signs BP (!) 119/93   Pulse 60   Temp 98.3 F (36.8 C) (Oral)   Resp 18   Ht 6\' 7"  (2.007 m)   Wt 90.7 kg   SpO2 92%   BMI 22.53 kg/m  Physical Exam Vitals and nursing note reviewed.  Constitutional:      Appearance: He is well-developed. He is not diaphoretic.  HENT:     Head: Normocephalic and atraumatic.     Mouth/Throat:     Mouth: Mucous membranes are not dry.  Eyes:     Conjunctiva/sclera: Conjunctivae normal.  Neck:     Vascular: Normal carotid pulses. No carotid bruit or JVD.     Trachea: Trachea normal. No tracheal deviation.  Cardiovascular:     Rate and Rhythm: Normal rate and regular rhythm.     Pulses: No decreased pulses.          Radial pulses are 2+ on the right side and 2+ on the left side.       Dorsalis pedis pulses are 2+ on the right side and 2+ on the left side.       Posterior tibial pulses are 2+ on the right side and 2+ on the left side.     Heart sounds: Normal heart sounds, S1 normal and S2 normal.  Heart sounds not distant. No murmur heard. Pulmonary:     Effort: Pulmonary effort is normal. No respiratory distress.     Breath sounds: Normal breath sounds. No wheezing.  Chest:     Chest wall: No tenderness.  Abdominal:     General: Bowel sounds are normal.     Palpations: Abdomen is soft.     Tenderness: There is no abdominal tenderness. There is no guarding or rebound.  Musculoskeletal:     Cervical back: Normal range of motion and neck supple. No muscular tenderness.     Right lower leg: No edema.     Left lower leg: No edema.  Skin:    General: Skin is warm and dry.     Coloration: Skin is not pale.  Neurological:     Mental Status: He is alert. Mental status is at baseline.  Psychiatric:        Mood and Affect: Mood normal.     ED Results /  Procedures / Treatments   Labs (all labs ordered are listed, but only abnormal results are displayed) Labs Reviewed  MAGNESIUM - Abnormal; Notable for the following components:      Result Value   Magnesium 1.5 (*)    All other components within normal limits  CBC WITH DIFFERENTIAL/PLATELET  BASIC METABOLIC PANEL  CK    EKG None  Radiology No results found.  Procedures Procedures  {Document cardiac monitor, telemetry assessment procedure when appropriate:1}  Medications Ordered in ED Medications - No data to display  ED Course/ Medical Decision Making/ A&P   {   Click here for ABCD2, HEART and other calculatorsREFRESH Note before signing :1}                          Medical Decision Making Amount and/or Complexity of Data Reviewed Labs: ordered.   ***  {Document critical care time when appropriate:1} {Document review of labs and clinical decision tools ie heart score, Chads2Vasc2 etc:1}  {Document your independent review of radiology images, and any outside records:1} {Document your discussion with family members, caretakers, and with consultants:1} {Document social determinants of health affecting pt's care:1} {Document your decision making why or why not admission, treatments were needed:1} Final Clinical Impression(s) / ED Diagnoses Final diagnoses:  None    Rx / DC Orders ED Discharge Orders     None

## 2023-02-19 NOTE — ED Triage Notes (Signed)
Patient here POV from Home.  Endorses Bilateral Leg and Feet cramping-like pain for a few weeks. Seen for same a few days ago. Seeks Re-Evaluation for continued Pain.   NAD Noted during Triage. A&Ox4. GCS 15. Ambulatory.

## 2023-02-20 MED ORDER — MAGNESIUM OXIDE 400 MG PO TABS: 400.0000 mg | ORAL_TABLET | Freq: Every day | ORAL | 0 refills | Status: DC

## 2023-02-20 MED ORDER — MAGNESIUM OXIDE -MG SUPPLEMENT 400 (240 MG) MG PO TABS
400.0000 mg | ORAL_TABLET | Freq: Once | ORAL | Status: AC
  Administered 2023-02-20: 400 mg via ORAL
  Filled 2023-02-20: qty 1

## 2023-02-20 MED ORDER — OXYCODONE-ACETAMINOPHEN 5-325 MG PO TABS
1.0000 | ORAL_TABLET | Freq: Once | ORAL | Status: AC
  Administered 2023-02-20: 1 via ORAL
  Filled 2023-02-20: qty 1

## 2023-02-20 NOTE — Discharge Instructions (Signed)
Please read and follow all provided instructions.  Your diagnoses today include:  1. Leg cramping   2. Hypomagnesemia     Tests performed today include: Blood cell counts and electrolytes: Your hemoglobin is high normal but continues to appear better Magnesium is again slightly low Blood test for muscle breakdown was normal Vital signs. See below for your results today.   Medications prescribed:  Magnesium supplement  Take any prescribed medications only as directed.  Home care instructions:  Follow any educational materials contained in this packet.  BE VERY CAREFUL not to take multiple medicines containing Tylenol (also called acetaminophen). Doing so can lead to an overdose which can damage your liver and cause liver failure and possibly death.   Follow-up instructions: Please follow-up with your primary care provider in the next 3 days for further evaluation of your symptoms.   Return instructions:  Please return to the Emergency Department if you experience worsening symptoms.  Please return if you have any other emergent concerns.  Additional Information:  Your vital signs today were: BP (!) 119/93   Pulse 60   Temp 98.3 F (36.8 C) (Oral)   Resp 18   Ht 6\' 7"  (2.007 m)   Wt 90.7 kg   SpO2 92%   BMI 22.53 kg/m  If your blood pressure (BP) was elevated above 135/85 this visit, please have this repeated by your doctor within one month. --------------

## 2023-02-22 ENCOUNTER — Inpatient Hospital Stay: Payer: 59

## 2023-02-22 ENCOUNTER — Other Ambulatory Visit: Payer: Self-pay

## 2023-02-22 ENCOUNTER — Encounter: Payer: Self-pay | Admitting: Internal Medicine

## 2023-02-22 ENCOUNTER — Other Ambulatory Visit: Payer: 59

## 2023-02-22 ENCOUNTER — Inpatient Hospital Stay: Payer: 59 | Attending: Hematology and Oncology | Admitting: Internal Medicine

## 2023-02-22 ENCOUNTER — Encounter: Payer: 59 | Admitting: Hematology and Oncology

## 2023-02-22 ENCOUNTER — Other Ambulatory Visit: Payer: Self-pay | Admitting: Medical Oncology

## 2023-02-22 VITALS — BP 146/99 | HR 67 | Temp 98.6°F | Resp 17 | Ht 79.0 in | Wt 198.5 lb

## 2023-02-22 DIAGNOSIS — D45 Polycythemia vera: Secondary | ICD-10-CM | POA: Diagnosis not present

## 2023-02-22 DIAGNOSIS — D582 Other hemoglobinopathies: Secondary | ICD-10-CM

## 2023-02-22 DIAGNOSIS — D751 Secondary polycythemia: Secondary | ICD-10-CM | POA: Diagnosis not present

## 2023-02-22 LAB — CBC WITH DIFFERENTIAL (CANCER CENTER ONLY)
Abs Immature Granulocytes: 0.03 10*3/uL (ref 0.00–0.07)
Basophils Absolute: 0.1 10*3/uL (ref 0.0–0.1)
Basophils Relative: 1 %
Eosinophils Absolute: 0 10*3/uL (ref 0.0–0.5)
Eosinophils Relative: 0 %
HCT: 49.4 % (ref 39.0–52.0)
Hemoglobin: 18 g/dL — ABNORMAL HIGH (ref 13.0–17.0)
Immature Granulocytes: 0 %
Lymphocytes Relative: 29 %
Lymphs Abs: 2.5 10*3/uL (ref 0.7–4.0)
MCH: 35 pg — ABNORMAL HIGH (ref 26.0–34.0)
MCHC: 36.4 g/dL — ABNORMAL HIGH (ref 30.0–36.0)
MCV: 96.1 fL (ref 80.0–100.0)
Monocytes Absolute: 1 10*3/uL (ref 0.1–1.0)
Monocytes Relative: 11 %
Neutro Abs: 5 10*3/uL (ref 1.7–7.7)
Neutrophils Relative %: 59 %
Platelet Count: 209 10*3/uL (ref 150–400)
RBC: 5.14 MIL/uL (ref 4.22–5.81)
RDW: 13.3 % (ref 11.5–15.5)
WBC Count: 8.7 10*3/uL (ref 4.0–10.5)
nRBC: 0 % (ref 0.0–0.2)

## 2023-02-22 LAB — FERRITIN: Ferritin: 183 ng/mL (ref 24–336)

## 2023-02-22 LAB — CMP (CANCER CENTER ONLY)
ALT: 30 U/L (ref 0–44)
AST: 38 U/L (ref 15–41)
Albumin: 4 g/dL (ref 3.5–5.0)
Alkaline Phosphatase: 41 U/L (ref 38–126)
Anion gap: 8 (ref 5–15)
BUN: 12 mg/dL (ref 6–20)
CO2: 25 mmol/L (ref 22–32)
Calcium: 10.2 mg/dL (ref 8.9–10.3)
Chloride: 103 mmol/L (ref 98–111)
Creatinine: 1.14 mg/dL (ref 0.61–1.24)
GFR, Estimated: >60 mL/min (ref >60)
Glucose, Bld: 91 mg/dL (ref 70–99)
Potassium: 4 mmol/L (ref 3.5–5.1)
Sodium: 136 mmol/L (ref 135–145)
Total Bilirubin: 1.7 mg/dL — ABNORMAL HIGH (ref 0.3–1.2)
Total Protein: 7.3 g/dL (ref 6.5–8.1)

## 2023-02-22 LAB — IRON AND IRON BINDING CAPACITY (CC-WL,HP ONLY)
Iron: 152 ug/dL (ref 45–182)
Saturation Ratios: 43 % — ABNORMAL HIGH (ref 17.9–39.5)
TIBC: 354 ug/dL (ref 250–450)
UIBC: 202 ug/dL (ref 117–376)

## 2023-02-22 LAB — LACTATE DEHYDROGENASE: LDH: 133 U/L (ref 98–192)

## 2023-02-22 NOTE — Progress Notes (Signed)
[facility] [phone]   [phone]  CONSULT NOTE  REFERRING PHYSICIAN: Zandra Abts, NP  REASON FOR CONSULTATION:  57 years old white male with polycythemia.  HPI Billy White is a 57 y.o. male with past medical history significant for chronic back pain, depression, GERD as well as history of smoking and alcohol abuse.  The patient was complaining of pain in his feet right more than left months ago.  He presented to his primary care provider for evaluation and during his blood work he had CBC on 01/31/2023 that showed hemoglobin of 20.1 and hematocrit 59.6.  Repeat blood work on 02/13/2023 showed persistent polycythemia with hemoglobin of 17.4 and hematocrit 49.5%.  He has normal total white blood count as well as platelet count.  He was referred to me today for evaluation and recommendation regarding his condition.  The patient mention that he also donate plasma at least twice a week which may have been contributing to his hemoconcentration.  When seen today the patient mentions that he feels well with no concerning complaints except for the pain in his feet.  He was tried on gabapentin but could not tolerate it.  He denied having any current chest pain, shortness of breath, cough or hemoptysis.  He has no nausea, vomiting, diarrhea or constipation.  He has no headache or visual changes.  He denied having any significant weight loss or night sweats. Family history significant for mother with pancreatic cancer at age 62.  Father had hypertension and osteoarthritis. The patient is single and has 2 daughters age 29 and 23.  He works in [employer].  He has a history of smoking cigar since age 57.  He also drank large amount of beer and alcohol at regular basis.  He also smoked marijuana.  HPI  Past Medical History:  Diagnosis Date   Chronic back pain    Depression    GERD (gastroesophageal reflux disease)    History of chicken pox     Past  Surgical History:  Procedure Laterality Date   KNEE SURGERY     NOSE SURGERY      Family History  Problem Relation Age of Onset   Colon cancer Mother    Arthritis Father    Hypertension Father    COPD Paternal Grandmother     Social History Social History   Tobacco Use   Smoking status: Every Day    Types: Cigars   Smokeless tobacco: Never  Vaping Use   Vaping status: Never Used  Substance Use Topics   Alcohol use: Yes    Comment:  1 beer daily   Drug use: Yes    Frequency: 7.0 times per week    Types: Marijuana    Comment: 3-4 times a week    Allergies  Allergen Reactions   Tramadol     itching    Current Outpatient Medications  Medication Sig Dispense Refill   gabapentin (NEURONTIN) 100 MG capsule Take 1 capsule (100 mg total) by mouth at bedtime. 30 capsule 1   hydrOXYzine (VISTARIL) 25 MG capsule Take 1-2 tablets every night at bedtime as needed for anxiety and insomnia. 60 capsule 0   magnesium oxide (MAG-OX) 400 MG tablet Take 1 tablet (400 mg total) by mouth daily. 14 tablet 0   No current facility-administered medications for this visit.    Review of Systems  Constitutional: negative Eyes: negative Ears, nose, mouth, throat, and face: negative Respiratory: negative Cardiovascular: negative Gastrointestinal:  negative Genitourinary:negative Integument/breast: negative Hematologic/lymphatic: negative Musculoskeletal:negative Neurological: negative Behavioral/Psych: negative Endocrine: negative Allergic/Immunologic: negative  Physical Exam  UJW:JXBJY, healthy, no distress, well nourished, and well developed SKIN: skin color, texture, turgor are normal, no rashes or significant lesions HEAD: Normocephalic, No masses, lesions, tenderness or abnormalities EYES: normal, PERRLA, Conjunctiva are pink and non-injected EARS: External ears normal, Canals clear OROPHARYNX:no exudate, no erythema, and lips, buccal mucosa, and tongue normal  NECK:  supple, no adenopathy, no JVD LYMPH:  no palpable lymphadenopathy, no hepatosplenomegaly LUNGS: clear to auscultation , and palpation HEART: regular rate & rhythm, no murmurs, and no gallops ABDOMEN:abdomen soft, non-tender, normal bowel sounds, and no masses or organomegaly BACK: Back symmetric, no curvature., No CVA tenderness EXTREMITIES:no joint deformities, effusion, or inflammation, no edema  NEURO: alert & oriented x 3 with fluent speech, no focal motor/sensory deficits  PERFORMANCE STATUS: ECOG 1  LABORATORY DATA: Lab Results  Component Value Date   WBC 8.7 02/22/2023   HGB 18.0 (H) 02/22/2023   HCT 49.4 02/22/2023   MCV 96.1 02/22/2023   PLT 209 02/22/2023      Chemistry      Component Value Date/Time   NA 136 02/22/2023 1103   K 4.0 02/22/2023 1103   CL 103 02/22/2023 1103   CO2 25 02/22/2023 1103   BUN 12 02/22/2023 1103   CREATININE 1.14 02/22/2023 1103      Component Value Date/Time   CALCIUM 10.2 02/22/2023 1103   ALKPHOS 41 02/22/2023 1103   AST 38 02/22/2023 1103   ALT 30 02/22/2023 1103   BILITOT 1.7 (H) 02/22/2023 1103       RADIOGRAPHIC STUDIES: DG Chest 2 View  Result Date: 02/13/2023 CLINICAL DATA:  Left arm pain EXAM: CHEST - 2 VIEW COMPARISON:  12/09/2022 FINDINGS: Lungs are clear.  No pneumothorax. Heart size and mediastinal contours are within normal limits. No effusion. Visualized bones unremarkable. IMPRESSION: No acute cardiopulmonary disease. Electronically Signed   By: Corlis Leak M.D.   On: 02/13/2023 15:18   US Abdomen Limited RUQ (LIVER/GB)  Result Date: 02/05/2023 CLINICAL DATA:  Elevated liver enzymes. EXAM: ULTRASOUND ABDOMEN LIMITED RIGHT UPPER QUADRANT COMPARISON:  None Available. FINDINGS: Gallbladder: No gallstones or wall thickening visualized. No sonographic Murphy sign noted by sonographer. Common bile duct: Diameter: 2 mm Liver: There is diffuse increased liver echogenicity most commonly seen in the setting of fatty  infiltration. Superimposed inflammation or fibrosis is not excluded. Clinical correlation is recommended. Portal vein is patent on color Doppler imaging with normal direction of blood flow towards the liver. Other: None. IMPRESSION: Fatty liver, otherwise unremarkable right upper quadrant ultrasound. Electronically Signed   By: Elgie Collard M.D.   On: 02/05/2023 23:57    ASSESSMENT: This is a very pleasant 57 years old white male presented for evaluation of polycythemia that is multifactorial and likely reactive in nature secondary to smoking as well as frequent plasma donation.  Myeloproliferative disorder could not be completely excluded at this point.   PLAN: I had a lengthy discussion with the patient and his girlfriend today about his current condition and further investigation to rule out any underlying myeloproliferative disorder. I ordered several studies today including repeat CBC that showed hemoglobin of 18.0 and hematocrit 49.4% with normal total white blood count as well as platelet count.  Comprehensive metabolic panel showed elevated serum bilirubin of 1.7 but he had similar results in the past.  This could be secondary to his alcohol abuse and fatty liver. I ordered several  other studies including LDH, iron study and ferritin as well as JAK2 mutation panel. I will see the patient back for follow-up visit in 1 months for evaluation and discussion of the pending lab results and any other recommendation regarding his condition.  I strongly recommend for the patient to quit smoking and alcohol drinking. I also recommend for him to decrease the frequency of his plasma donation and to hydrate well after any donation. The patient was advised to call immediately if he has any other concerning symptoms in the interval.  The patient voices understanding of current disease status and treatment options and is in agreement with the current care plan.  All questions were answered. The patient  knows to call the clinic with any problems, questions or concerns. We can certainly see the patient much sooner if necessary.  Thank you so much for allowing me to participate in the care of Billy White. I will continue to follow up the patient with you and assist in his care.  The total time spent in the appointment was 60 minutes.  Disclaimer: This note was dictated with voice recognition software. Similar sounding words can inadvertently be transcribed and may not be corrected upon review.   Lajuana Matte February 22, 2023, 11:52 AM

## 2023-02-28 LAB — JAK2 (INCLUDING V617F AND EXON 12), MPL,& CALR-NEXT GEN SEQ

## 2023-03-01 NOTE — Progress Notes (Signed)
Established Patient Office Visit   Subjective:  Patient ID: Billy White, male    DOB: [birth year]  Age: 57 y.o. MRN: [id number]  Chief Complaint  Patient presents with   Follow-up    Pt is here today to F/U Pt reports Dr Arbutus Ped would like to increase his Gabapentin. Pt would like help with quit smoking.    HPI Anxiety/Depression: On last appointment, patient was prescribed Hydroxyzine 25mg  tablet, take 1-2 tablets at bedtime for anxiety and insomnia and referred to psychiatry. He was referred to [facility]. He reports he has not heard from this referral.   Patients hemoglobin was elevated at his last visit on 06/19, he was referred to hematology. Patient was seen at [facility] at [facility] with Dr. Si Gaul on 02/22/2023. Labs and several other studies ordered. He is suppose to have a follow up in 1 month, 03/29/2023. Patient reports provider recommended to increase Gabapentin.   Patients total bilirubin is elevated and AST is elevated at his last visit, he was recommend to have an ultrasound of his liver. He had an ultrasound on 02/05/2023 which showed fatty liver, otherwise unremarkable right upper quadrant ultrasound. Based on ultrasound, recommend a hepatologist. Patient was referred to [facility] by Annamarie Major, NP, telephone visit on 02/06/2023 and office visit scheduled 05/07/2023.   TSH was elevated with normal Free T3 and T4, recommended patient have a TSH redrawn in 6 week. Labs are scheduled for 03/19/2023.   Patient was prescribed Gabapentin 100mg  tablet, 1 tablet at bedtime for pain and numbness/tingling  in his extremities at his last visit. He was seen at [facility] health Emergency Department at [facility] on 02/13/2023 for chest pain and leg cramping. He was diagnosed with hypomagnesemia, hypokalemia, hypophosphatemia, and diarrhea. Potassium and magnesium was provided. He was recommend to eat rich  foods with potassium and magnesium. Patient returned back to ED at same location on 02/19/2023 for leg pain. Diagnosed with leg cramps and hypomagnesemia. He was provided magnesium supplement, 400mg  daily.   He reports the magnesium is helping a lot with his leg pain and would like to increase Gabapentin.  ROS See HPI above     Objective:   BP 108/70   Pulse 68   Temp 98.7 F (37.1 C)   Ht 6\' 7"  (2.007 m)   Wt 202 lb (91.6 kg)   SpO2 98%   BMI 22.76 kg/m    Physical Exam Vitals reviewed.  Constitutional:      General: He is not in acute distress.    Appearance: Normal appearance. He is not ill-appearing, toxic-appearing or diaphoretic.  Eyes:     General:        Right eye: No discharge.        Left eye: No discharge.     Conjunctiva/sclera: Conjunctivae normal.  Cardiovascular:     Rate and Rhythm: Normal rate and regular rhythm.     Heart sounds: Normal heart sounds. No murmur heard.    No friction rub. No gallop.  Pulmonary:     Effort: Pulmonary effort is normal. No respiratory distress.     Breath sounds: Normal breath sounds.  Musculoskeletal:        General: Normal range of motion.  Skin:    General: Skin is warm and dry.  Neurological:     General: No focal deficit present.     Mental Status: He is alert and oriented to person, place,  and time. Mental status is at baseline.  Psychiatric:        Mood and Affect: Mood normal.        Behavior: Behavior normal.        Thought Content: Thought content normal.        Judgment: Judgment normal.      Assessment & Plan:  Anxiety associated with depression Assessment & Plan: Advise patient to get in contact with following for anxiety and depression: [facility]  [address], [place], [place] [address]  Phone # [phone]   He was referred there at from his last visit.    Leg cramps -     Gabapentin; Take 1 capsule (300 mg total) by mouth at bedtime.  Dispense: 90  capsule; Refill: 1 -     Magnesium Oxide; Take 1 tablet (400 mg total) by mouth daily.  Dispense: 30 tablet; Refill: 2  Numbness and tingling -     Gabapentin; Take 1 capsule (300 mg total) by mouth at bedtime.  Dispense: 90 capsule; Refill: 1  Encounter for smoking cessation counseling -     Ambulatory referral to Smoking Cessation -     Nicotine; Place 1 patch (21 mg total) onto the skin daily.  Dispense: 28 patch; Refill: 0  -INCREASE Gabapentin to 300mg  daily at bedtime for leg cramps and numbness and tingling. A prescription with this dose of being 1 tablet has been sent into pharmacy. However, finish the 100mg  tablets prescription, advised patient taking 3 tablets of the 100mg  to equal 300mg  at night.  -Refilled Magnesium 400mg  daily for leg cramps since it is effective. Will re-evaluate magnesium level at next appointment.  -Placed a referral to smoking cessation and prescribed Nicoderm patch, 1 patch daily. Patient voiced he is ready to quit smoking cigars.   Return in about 1 month (around 04/02/2023) for follow-up; please cancel the 08/05 lab and will do this lab at his appointment.   Zandra Abts, NP

## 2023-03-02 ENCOUNTER — Encounter: Payer: Self-pay | Admitting: Family Medicine

## 2023-03-02 ENCOUNTER — Ambulatory Visit (INDEPENDENT_AMBULATORY_CARE_PROVIDER_SITE_OTHER): Payer: 59 | Admitting: Family Medicine

## 2023-03-02 VITALS — BP 108/70 | HR 68 | Temp 98.7°F | Ht 79.0 in | Wt 202.0 lb

## 2023-03-02 DIAGNOSIS — Z716 Tobacco abuse counseling: Secondary | ICD-10-CM

## 2023-03-02 DIAGNOSIS — R252 Cramp and spasm: Secondary | ICD-10-CM | POA: Diagnosis not present

## 2023-03-02 DIAGNOSIS — F418 Other specified anxiety disorders: Secondary | ICD-10-CM | POA: Diagnosis not present

## 2023-03-02 DIAGNOSIS — R202 Paresthesia of skin: Secondary | ICD-10-CM

## 2023-03-02 DIAGNOSIS — R200 Anesthesia of skin: Secondary | ICD-10-CM

## 2023-03-02 MED ORDER — NICOTINE 21 MG/24HR TD PT24
21.0000 mg | MEDICATED_PATCH | Freq: Every day | TRANSDERMAL | 0 refills | Status: AC
Start: 2023-03-02 — End: ?

## 2023-03-02 MED ORDER — GABAPENTIN 300 MG PO CAPS
300.0000 mg | ORAL_CAPSULE | Freq: Every day | ORAL | 1 refills | Status: DC
Start: 2023-03-02 — End: 2023-04-02

## 2023-03-02 MED ORDER — MAGNESIUM OXIDE 400 MG PO TABS
400.0000 mg | ORAL_TABLET | Freq: Every day | ORAL | 2 refills | Status: AC
Start: 2023-03-02 — End: 2023-05-31

## 2023-03-02 NOTE — Assessment & Plan Note (Signed)
Advise patient to get in contact with following for anxiety and depression: [facility]  [address], [place], [place] [address]  Phone # [phone]   He was referred there at from his last visit.

## 2023-03-02 NOTE — Patient Instructions (Addendum)
-  Please get in contact with following for anxiety and depression: [facility]  [address], [place], [place] [address]  Phone # [phone]  -INCREASE Gabapentin to 300mg  daily at bedtime. I have sent a prescription with this dose of being 1 tablet. However, finish the 100mg  tablets you have by taking 3 tablets of the 100mg  to equal 300mg  at night.  -Refilled Magnesium 400mg  daily.  -Placed a referral to smoking cessation and prescribed Nicoderm patch, 1 patch daily.  -Follow up in 1 month. Discontinue 08/05 lab visit, will do TSH at next appointment.

## 2023-03-14 ENCOUNTER — Encounter (INDEPENDENT_AMBULATORY_CARE_PROVIDER_SITE_OTHER): Payer: Self-pay

## 2023-03-19 ENCOUNTER — Other Ambulatory Visit: Payer: 59

## 2023-03-19 ENCOUNTER — Other Ambulatory Visit: Payer: Self-pay | Admitting: Family Medicine

## 2023-03-19 DIAGNOSIS — F419 Anxiety disorder, unspecified: Secondary | ICD-10-CM

## 2023-03-19 DIAGNOSIS — G4700 Insomnia, unspecified: Secondary | ICD-10-CM

## 2023-03-29 ENCOUNTER — Inpatient Hospital Stay: Payer: 59 | Attending: Hematology and Oncology

## 2023-03-29 ENCOUNTER — Inpatient Hospital Stay: Payer: 59 | Admitting: Internal Medicine

## 2023-03-29 DIAGNOSIS — D751 Secondary polycythemia: Secondary | ICD-10-CM | POA: Insufficient documentation

## 2023-04-01 NOTE — Progress Notes (Unsigned)
   Established Patient Office Visit   Subjective:  Patient ID: Billy White, male    DOB: August 24, 1965  Age: 57 y.o. MRN: [id number]  No chief complaint on file.   HPI Anxiety/Depression: On last visit he was suppose to follow up with a referral to psychiatry with [facility].   Patient is still waiting on appointment with [facility] on 05/07/2023 for total bilirubin is elevated and AST is elevated at his last visit, he was recommend to have an ultrasound of his liver. He had an ultrasound on 02/05/2023 which showed fatty liver, otherwise unremarkable right upper quadrant ultrasound. Based on ultrasound, recommend a hepatologist.  TSH was elevated, 5.64, on 01/31/2023 with normal Free T3 and T4. He needs to have his TSH rechecked.   Leg cramps/numbness and tingling: Previous visit, Gabapentin was increased to 300mg  at bedtime. Also, he continue to take Magnesium Oxide 400mg  daily to help with leg cramps.   On previous visit, patient was referred to smoking cessation and prescribed Nicoderm patch to help quit smoking.  ROS See HPI above     Objective:     There were no vitals taken for this visit. {Vitals History (Optional):23777}  Physical Exam  No results found for any visits on 04/02/23.  The 10-year ASCVD risk score (Arnett DK, et al., 2019) is: 6.1%    Assessment & Plan:  There are no diagnoses linked to this encounter. 1.Review health maintenance:  -Colonosocpy-placed a referral to [facility] -Covid vaccine- -Influenza vaccine- -Zoster vaccine-  No follow-ups on file.   Zandra Abts, NP

## 2023-04-02 ENCOUNTER — Ambulatory Visit (INDEPENDENT_AMBULATORY_CARE_PROVIDER_SITE_OTHER): Payer: 59 | Admitting: Family Medicine

## 2023-04-02 ENCOUNTER — Telehealth: Payer: Self-pay | Admitting: Internal Medicine

## 2023-04-02 ENCOUNTER — Encounter: Payer: Self-pay | Admitting: Family Medicine

## 2023-04-02 VITALS — BP 102/68 | HR 68 | Temp 98.5°F | Ht 79.0 in | Wt 200.1 lb

## 2023-04-02 DIAGNOSIS — F418 Other specified anxiety disorders: Secondary | ICD-10-CM

## 2023-04-02 DIAGNOSIS — Z716 Tobacco abuse counseling: Secondary | ICD-10-CM

## 2023-04-02 DIAGNOSIS — R252 Cramp and spasm: Secondary | ICD-10-CM

## 2023-04-02 DIAGNOSIS — R7989 Other specified abnormal findings of blood chemistry: Secondary | ICD-10-CM

## 2023-04-02 DIAGNOSIS — R062 Wheezing: Secondary | ICD-10-CM

## 2023-04-02 DIAGNOSIS — Z87891 Personal history of nicotine dependence: Secondary | ICD-10-CM

## 2023-04-02 DIAGNOSIS — R200 Anesthesia of skin: Secondary | ICD-10-CM | POA: Diagnosis not present

## 2023-04-02 DIAGNOSIS — R202 Paresthesia of skin: Secondary | ICD-10-CM | POA: Diagnosis not present

## 2023-04-02 MED ORDER — GABAPENTIN 300 MG PO CAPS
300.0000 mg | ORAL_CAPSULE | Freq: Three times a day (TID) | ORAL | 3 refills | Status: DC
Start: 2023-04-02 — End: 2023-12-31

## 2023-04-02 MED ORDER — ALBUTEROL SULFATE HFA 108 (90 BASE) MCG/ACT IN AERS
2.0000 | INHALATION_SPRAY | Freq: Four times a day (QID) | RESPIRATORY_TRACT | 0 refills | Status: AC | PRN
Start: 2023-04-02 — End: ?

## 2023-04-02 NOTE — Patient Instructions (Signed)
-  Ordered thyroid panel. Unfortunately, our lab in office is closed today. Recommend to make an appointment sometime this week to have them drawn. Office will call with lab results and you may see them on MyChart.  -Continue with all prescribed medication. INCREASE Gabapentin 300mg  tablet, 3 times a day.  -Please make sure you go to your psych appointment and hepatology appointment -Placed a referral to pulmonary for a CT lung screening. Will order Albuterol Inhaler to use as needed for reported wheezing.  -Follow up in 3 months for chronic management.

## 2023-04-03 ENCOUNTER — Inpatient Hospital Stay (HOSPITAL_BASED_OUTPATIENT_CLINIC_OR_DEPARTMENT_OTHER): Payer: 59 | Admitting: Internal Medicine

## 2023-04-03 ENCOUNTER — Inpatient Hospital Stay: Payer: 59

## 2023-04-03 VITALS — BP 126/88 | HR 65 | Temp 98.4°F | Resp 16 | Ht 79.0 in | Wt 200.0 lb

## 2023-04-03 DIAGNOSIS — D751 Secondary polycythemia: Secondary | ICD-10-CM | POA: Diagnosis not present

## 2023-04-03 DIAGNOSIS — D45 Polycythemia vera: Secondary | ICD-10-CM

## 2023-04-03 LAB — CBC WITH DIFFERENTIAL (CANCER CENTER ONLY)
Abs Immature Granulocytes: 0.01 10*3/uL (ref 0.00–0.07)
Basophils Absolute: 0.1 10*3/uL (ref 0.0–0.1)
Basophils Relative: 1 %
Eosinophils Absolute: 0 10*3/uL (ref 0.0–0.5)
Eosinophils Relative: 0 %
HCT: 47 % (ref 39.0–52.0)
Hemoglobin: 16.8 g/dL (ref 13.0–17.0)
Immature Granulocytes: 0 %
Lymphocytes Relative: 30 %
Lymphs Abs: 2.3 10*3/uL (ref 0.7–4.0)
MCH: 34.2 pg — ABNORMAL HIGH (ref 26.0–34.0)
MCHC: 35.7 g/dL (ref 30.0–36.0)
MCV: 95.7 fL (ref 80.0–100.0)
Monocytes Absolute: 0.7 10*3/uL (ref 0.1–1.0)
Monocytes Relative: 9 %
Neutro Abs: 4.6 10*3/uL (ref 1.7–7.7)
Neutrophils Relative %: 60 %
Platelet Count: 225 10*3/uL (ref 150–400)
RBC: 4.91 MIL/uL (ref 4.22–5.81)
RDW: 13 % (ref 11.5–15.5)
WBC Count: 7.7 10*3/uL (ref 4.0–10.5)
nRBC: 0 % (ref 0.0–0.2)

## 2023-04-03 LAB — LACTATE DEHYDROGENASE: LDH: 174 U/L (ref 98–192)

## 2023-04-03 NOTE — Progress Notes (Signed)
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  PRIOR THERAPY: None  CURRENT THERAPY: Observation  INTERVAL HISTORY: Billy White 57 y.o. male returns to the clinic today for follow-up visit accompanied by his girlfriend Maralyn Sago.  The patient is feeling fine today with no concerning complaints except for pain issues in this is handled by his primary care provider Zandra Abts.  He is currently on gabapentin and his dose was increased to 3 tablets a day but he has a lot of dizzy spells with that.  He denied having any chest pain, shortness of breath, cough or hemoptysis.  He has no nausea, vomiting, diarrhea or constipation.  He continues to do plasma donation twice a week.  He had molecular studies for the JAK2 mutation performed recently and he is here for evaluation and discussion of his lab results.  MEDICAL HISTORY: Past Medical History:  Diagnosis Date   Chronic back pain    Depression    GERD (gastroesophageal reflux disease)    History of chicken pox     ALLERGIES:  is allergic to tramadol.  MEDICATIONS:  Current Outpatient Medications  Medication Sig Dispense Refill   albuterol (VENTOLIN HFA) 108 (90 Base) MCG/ACT inhaler Inhale 2 puffs into the lungs every 6 (six) hours as needed for wheezing or shortness of breath. 8 g 0   gabapentin (NEURONTIN) 300 MG capsule Take 1 capsule (300 mg total) by mouth 3 (three) times daily. 90 capsule 3   hydrOXYzine (VISTARIL) 25 MG capsule TAKE 1 TO 2 CAPSULES BY MOUTH EVERY NIGHT AT BEDTIME AS NEEDED FOR ANXIETY AND FOR INSOMNIA 60 capsule 0   magnesium oxide (MAG-OX) 400 MG tablet Take 1 tablet (400 mg total) by mouth daily. 30 tablet 2   nicotine (NICODERM CQ) 21  mg/24hr patch Place 1 patch (21 mg total) onto the skin daily. (Patient not taking: Reported on 04/02/2023) 28 patch 0   No current facility-administered medications for this visit.    SURGICAL HISTORY:  Past Surgical History:  Procedure Laterality Date   KNEE SURGERY     NOSE SURGERY      REVIEW OF SYSTEMS:  A comprehensive review of systems was negative except for: Musculoskeletal: positive for arthralgias and back pain   PHYSICAL EXAMINATION: General appearance: alert, cooperative, and no distress Head: Normocephalic, without obvious abnormality, atraumatic Neck: no adenopathy, no JVD, supple, symmetrical, trachea midline, and thyroid not enlarged, symmetric, no tenderness/mass/nodules Lymph nodes: Cervical, supraclavicular, and axillary nodes normal. Resp: clear to auscultation bilaterally Back: symmetric, no curvature. ROM normal. No CVA tenderness. Cardio: regular rate and rhythm, S1, S2 normal, no murmur, click, rub or gallop GI: soft, non-tender; bowel sounds normal; no masses,  no organomegaly Extremities: extremities normal, atraumatic, no cyanosis or edema  ECOG PERFORMANCE STATUS: 1 - Symptomatic but completely ambulatory  Blood pressure 126/88, pulse 65, temperature 98.4 F (36.9 C), temperature source Oral, resp. rate 16, height 6\' 7"  (2.007 m), weight 200 lb (90.7 kg), SpO2 97%.  LABORATORY DATA: Lab Results  Component Value Date   WBC 7.7 04/03/2023   HGB 16.8 04/03/2023   HCT 47.0 04/03/2023   MCV 95.7 04/03/2023   PLT 225 04/03/2023      Chemistry      Component  Value Date/Time   NA 136 02/22/2023 1103   K 4.0 02/22/2023 1103   CL 103 02/22/2023 1103   CO2 25 02/22/2023 1103   BUN 12 02/22/2023 1103   CREATININE 1.14 02/22/2023 1103      Component Value Date/Time   CALCIUM 10.2 02/22/2023 1103   ALKPHOS 41 02/22/2023 1103   AST 38 02/22/2023 1103   ALT 30 02/22/2023 1103   BILITOT 1.7 (H) 02/22/2023 1103       RADIOGRAPHIC STUDIES: No  results found.  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 The patient is feeling fine today with no concerning complaints except for the chronic back pain and arthralgia. Repeat CBC today showed normal hemoglobin and hematocrit. I recommended for the patient to continue on observation and to decrease the frequency of his plasma donation to once every 2 weeks rather than twice a week.  He was also advised to increase his hydration after the plasma donation. I will see him back for follow-up visit in 6 months for evaluation and repeat blood work. The patient was advised to call immediately if he has any other concerning symptoms in the interval. The patient voices understanding of current disease status and treatment options and is in agreement with the current care plan.  All questions were answered. The patient knows to call the clinic with any problems, questions or concerns. We can certainly see the patient much sooner if necessary. The total time spent in the appointment was 20 minutes.  Disclaimer: This note was dictated with voice recognition software. Similar sounding words can inadvertently be transcribed and may not be corrected upon review.

## 2023-04-05 ENCOUNTER — Other Ambulatory Visit: Payer: 59

## 2023-04-09 ENCOUNTER — Other Ambulatory Visit: Payer: 59

## 2023-04-12 ENCOUNTER — Other Ambulatory Visit (INDEPENDENT_AMBULATORY_CARE_PROVIDER_SITE_OTHER): Payer: 59

## 2023-04-12 DIAGNOSIS — R7989 Other specified abnormal findings of blood chemistry: Secondary | ICD-10-CM | POA: Diagnosis not present

## 2023-04-13 ENCOUNTER — Telehealth: Payer: Self-pay

## 2023-04-13 LAB — TSH: TSH: 2.69 u[IU]/mL (ref 0.35–5.50)

## 2023-04-13 LAB — T3, FREE: T3, Free: 3.8 pg/mL (ref 2.3–4.2)

## 2023-04-13 LAB — T4, FREE: Free T4: 0.73 ng/dL (ref 0.60–1.60)

## 2023-04-13 NOTE — Telephone Encounter (Signed)
-----   Message from Zandra Abts sent at 04/13/2023  7:48 AM EDT ----- Thyroid labs are good. No need for medication.

## 2023-04-13 NOTE — Telephone Encounter (Signed)
Called unable to leave vm 

## 2023-04-17 NOTE — Telephone Encounter (Signed)
Pt has been notified.

## 2023-04-23 DIAGNOSIS — Z79899 Other long term (current) drug therapy: Secondary | ICD-10-CM | POA: Diagnosis not present

## 2023-04-23 DIAGNOSIS — F1290 Cannabis use, unspecified, uncomplicated: Secondary | ICD-10-CM | POA: Diagnosis not present

## 2023-04-23 DIAGNOSIS — F411 Generalized anxiety disorder: Secondary | ICD-10-CM | POA: Diagnosis not present

## 2023-04-23 DIAGNOSIS — F3181 Bipolar II disorder: Secondary | ICD-10-CM | POA: Diagnosis not present

## 2023-05-07 DIAGNOSIS — Z789 Other specified health status: Secondary | ICD-10-CM | POA: Diagnosis not present

## 2023-05-07 DIAGNOSIS — K760 Fatty (change of) liver, not elsewhere classified: Secondary | ICD-10-CM | POA: Diagnosis not present

## 2023-05-07 DIAGNOSIS — R748 Abnormal levels of other serum enzymes: Secondary | ICD-10-CM | POA: Diagnosis not present

## 2023-05-08 ENCOUNTER — Other Ambulatory Visit: Payer: Self-pay | Admitting: Family Medicine

## 2023-05-08 ENCOUNTER — Other Ambulatory Visit: Payer: Self-pay

## 2023-05-08 DIAGNOSIS — G4700 Insomnia, unspecified: Secondary | ICD-10-CM

## 2023-05-08 DIAGNOSIS — F419 Anxiety disorder, unspecified: Secondary | ICD-10-CM

## 2023-05-08 NOTE — Telephone Encounter (Signed)
Last refill 03/20/2023 Last office visit 04/02/2023 No upcoming appt.

## 2023-05-18 ENCOUNTER — Other Ambulatory Visit: Payer: Self-pay | Admitting: Family Medicine

## 2023-05-18 DIAGNOSIS — Z1211 Encounter for screening for malignant neoplasm of colon: Secondary | ICD-10-CM

## 2023-05-18 DIAGNOSIS — Z1212 Encounter for screening for malignant neoplasm of rectum: Secondary | ICD-10-CM

## 2023-05-21 DIAGNOSIS — F3181 Bipolar II disorder: Secondary | ICD-10-CM | POA: Diagnosis not present

## 2023-05-21 DIAGNOSIS — F411 Generalized anxiety disorder: Secondary | ICD-10-CM | POA: Diagnosis not present

## 2023-05-21 DIAGNOSIS — F1290 Cannabis use, unspecified, uncomplicated: Secondary | ICD-10-CM | POA: Diagnosis not present

## 2023-05-31 ENCOUNTER — Ambulatory Visit: Payer: 59 | Admitting: Adult Health

## 2023-06-01 ENCOUNTER — Encounter: Payer: Self-pay | Admitting: Adult Health

## 2023-06-01 ENCOUNTER — Ambulatory Visit (INDEPENDENT_AMBULATORY_CARE_PROVIDER_SITE_OTHER): Payer: 59 | Admitting: Adult Health

## 2023-06-01 VITALS — BP 110/80 | HR 66 | Temp 98.1°F | Ht 79.0 in | Wt 205.0 lb

## 2023-06-01 DIAGNOSIS — J0110 Acute frontal sinusitis, unspecified: Secondary | ICD-10-CM | POA: Diagnosis not present

## 2023-06-01 MED ORDER — DOXYCYCLINE HYCLATE 100 MG PO CAPS
100.0000 mg | ORAL_CAPSULE | Freq: Two times a day (BID) | ORAL | 0 refills | Status: DC
Start: 2023-06-01 — End: 2023-12-31

## 2023-06-01 NOTE — Progress Notes (Signed)
Subjective:    Patient ID: Billy White, male    DOB: September 29, 1965, 57 y.o.   MRN: [id number]  HPI 57 year old male who  has a past medical history of Chronic back pain, Depression, GERD (gastroesophageal reflux disease), and History of chicken pox.  He presents to the office today for an acute issue. His symptoms started about a week ago. Symptoms include that of headache, nasal congestion ,chest congestion and a productive cough with discolored mucus. He denies fevers or chills. He does have chronic SOB from smoking.   At home he has been using Advil which helps get rid of the headache for a short period of time    Review of Systems See HPI   Past Medical History:  Diagnosis Date   Chronic back pain    Depression    GERD (gastroesophageal reflux disease)    History of chicken pox     Social History   Socioeconomic History   Marital status: Divorced    Spouse name: Not on file   Number of children: 2   Years of education: Not on file   Highest education level: High school graduate  Occupational History   Occupation: self employed  Tobacco Use   Smoking status: Every Day    Types: Cigars   Smokeless tobacco: Never  Vaping Use   Vaping status: Never Used  Substance and Sexual Activity   Alcohol use: Yes    Comment:  1 beer daily   Drug use: Yes    Frequency: 7.0 times per week    Types: Marijuana    Comment: 3-4 times a week   Sexual activity: Yes  Other Topics Concern   Not on file  Social History Narrative   Not on file   Social Determinants of Health   Financial Resource Strain: Low Risk  (01/31/2023)   Overall Financial Resource Strain (CARDIA)    Difficulty of Paying Living Expenses: Not very hard  Food Insecurity: Medium Risk (05/07/2023)   Received from [facility]   Hunger Vital Sign    Worried About Running Out of Food in the Last Year: Sometimes true    Ran Out of Food in the Last Year: Sometimes true  Transportation Needs: No Transportation  Needs (05/07/2023)   Received from [facility]    In the past 12 months, has lack of reliable transportation kept you from medical appointments, meetings, work or from getting things needed for daily living? : No  Physical Activity: Sufficiently Active (01/31/2023)   Exercise Vital Sign    Days of Exercise per Week: 7 days    Minutes of Exercise per Session: 120 min  Stress: Stress Concern Present (01/31/2023)   [employer] of Occupational Health - Occupational Stress Questionnaire    Feeling of Stress : Very much  Social Connections: Socially Isolated (01/31/2023)   Social Connection and Isolation Panel [NHANES]    Frequency of Communication with Friends and Family: More than three times a week    Frequency of Social Gatherings with Friends and Family: Once a week    Attends Religious Services: Never    Database administrator or Organizations: No    Attends Banker Meetings: Never    Marital Status: Divorced  Catering manager Violence: Not At Risk (01/31/2023)   Humiliation, Afraid, Rape, and Kick questionnaire    Fear of Current or Ex-Partner: No    Emotionally Abused: No    Physically Abused: No  Sexually Abused: No    Past Surgical History:  Procedure Laterality Date   KNEE SURGERY     NOSE SURGERY      Family History  Problem Relation Age of Onset   Colon cancer Mother    Arthritis Father    Hypertension Father    COPD Paternal Grandmother     Allergies  Allergen Reactions   Tramadol     itching    Current Outpatient Medications on File Prior to Visit  Medication Sig Dispense Refill   albuterol (VENTOLIN HFA) 108 (90 Base) MCG/ACT inhaler Inhale 2 puffs into the lungs every 6 (six) hours as needed for wheezing or shortness of breath. 8 g 0   gabapentin (NEURONTIN) 300 MG capsule Take 1 capsule (300 mg total) by mouth 3 (three) times daily. 90 capsule 3   hydrOXYzine (VISTARIL) 25 MG capsule TAKE 1 TO 2 CAPSULES BY MOUTH EVERY  NIGHT AT BEDTIME AS NEEDED FOR ANXIETY AND FOR INSOMNIA 60 capsule 0   nicotine (NICODERM CQ) 21 mg/24hr patch Place 1 patch (21 mg total) onto the skin daily. 28 patch 0   No current facility-administered medications on file prior to visit.    BP 110/80   Pulse 66   Temp 98.1 F (36.7 C) (Oral)   Ht 6\' 7"  (2.007 m)   Wt 205 lb (93 kg)   SpO2 97%   BMI 23.09 kg/m       Objective:   Physical Exam Vitals and nursing note reviewed.  Constitutional:      Appearance: Normal appearance.  HENT:     Head: Normocephalic.     Right Ear: Tympanic membrane, ear canal and external ear normal. There is no impacted cerumen.     Left Ear: Tympanic membrane, ear canal and external ear normal. There is no impacted cerumen.     Nose: Congestion and rhinorrhea present. Rhinorrhea is purulent.     Right Turbinates: Enlarged and swollen.     Right Sinus: Maxillary sinus tenderness and frontal sinus tenderness present.  Cardiovascular:     Rate and Rhythm: Normal rate and regular rhythm.     Pulses: Normal pulses.     Heart sounds: Normal heart sounds.  Pulmonary:     Effort: Pulmonary effort is normal.     Breath sounds: Normal breath sounds.  Musculoskeletal:        General: Normal range of motion.  Skin:    General: Skin is warm and dry.  Neurological:     General: No focal deficit present.     Mental Status: He is alert and oriented to person, place, and time.  Psychiatric:        Mood and Affect: Mood normal.        Behavior: Behavior normal.        Thought Content: Thought content normal.        Judgment: Judgment normal.        Assessment & Plan:  1. Acute non-recurrent frontal sinusitis - Will treat due to symptoms and duration.  - Will send in doxy and advised rest and hydration  - Follow up if not improving in the next 2-3 days  - doxycycline (VIBRAMYCIN) 100 MG capsule; Take 1 capsule (100 mg total) by mouth 2 (two) times daily.  Dispense: 14 capsule; Refill: 0  Shirline Frees, NP  Time spent with patient today was 30 minutes which consisted of chart review, discussing sinusitis , work up, treatment answering questions and documentation.

## 2023-06-18 DIAGNOSIS — H2511 Age-related nuclear cataract, right eye: Secondary | ICD-10-CM | POA: Diagnosis not present

## 2023-06-18 DIAGNOSIS — H2512 Age-related nuclear cataract, left eye: Secondary | ICD-10-CM | POA: Diagnosis not present

## 2023-06-18 DIAGNOSIS — H40033 Anatomical narrow angle, bilateral: Secondary | ICD-10-CM | POA: Diagnosis not present

## 2023-06-18 DIAGNOSIS — H40053 Ocular hypertension, bilateral: Secondary | ICD-10-CM | POA: Diagnosis not present

## 2023-06-20 DIAGNOSIS — F1290 Cannabis use, unspecified, uncomplicated: Secondary | ICD-10-CM | POA: Diagnosis not present

## 2023-06-20 DIAGNOSIS — F411 Generalized anxiety disorder: Secondary | ICD-10-CM | POA: Diagnosis not present

## 2023-06-20 DIAGNOSIS — F3181 Bipolar II disorder: Secondary | ICD-10-CM | POA: Diagnosis not present

## 2023-07-03 ENCOUNTER — Ambulatory Visit: Payer: 59 | Admitting: Family Medicine

## 2023-07-06 DIAGNOSIS — H40051 Ocular hypertension, right eye: Secondary | ICD-10-CM | POA: Diagnosis not present

## 2023-07-06 DIAGNOSIS — H2511 Age-related nuclear cataract, right eye: Secondary | ICD-10-CM | POA: Diagnosis not present

## 2023-07-06 DIAGNOSIS — H40053 Ocular hypertension, bilateral: Secondary | ICD-10-CM | POA: Diagnosis not present

## 2023-07-09 ENCOUNTER — Ambulatory Visit: Payer: 59 | Admitting: Family Medicine

## 2023-07-10 ENCOUNTER — Emergency Department (HOSPITAL_BASED_OUTPATIENT_CLINIC_OR_DEPARTMENT_OTHER): Payer: 59

## 2023-07-10 ENCOUNTER — Emergency Department (HOSPITAL_BASED_OUTPATIENT_CLINIC_OR_DEPARTMENT_OTHER)
Admission: EM | Admit: 2023-07-10 | Discharge: 2023-07-10 | Disposition: A | Discharge status: Home / Self Care | Payer: 59 | Attending: Emergency Medicine | Admitting: Emergency Medicine

## 2023-07-10 ENCOUNTER — Other Ambulatory Visit: Payer: Self-pay

## 2023-07-10 DIAGNOSIS — R1011 Right upper quadrant pain: Secondary | ICD-10-CM | POA: Insufficient documentation

## 2023-07-10 DIAGNOSIS — R0602 Shortness of breath: Secondary | ICD-10-CM | POA: Diagnosis not present

## 2023-07-10 DIAGNOSIS — R600 Localized edema: Secondary | ICD-10-CM | POA: Diagnosis not present

## 2023-07-10 DIAGNOSIS — M7989 Other specified soft tissue disorders: Secondary | ICD-10-CM | POA: Diagnosis not present

## 2023-07-10 DIAGNOSIS — R748 Abnormal levels of other serum enzymes: Secondary | ICD-10-CM | POA: Insufficient documentation

## 2023-07-10 DIAGNOSIS — R0600 Dyspnea, unspecified: Secondary | ICD-10-CM | POA: Diagnosis not present

## 2023-07-10 LAB — BRAIN NATRIURETIC PEPTIDE: B Natriuretic Peptide: 14.3 pg/mL (ref 0.0–100.0)

## 2023-07-10 LAB — COMPREHENSIVE METABOLIC PANEL
ALT: 5 U/L (ref 0–44)
AST: 12 U/L — ABNORMAL LOW (ref 15–41)
Albumin: 3.6 g/dL (ref 3.5–5.0)
Alkaline Phosphatase: 48 U/L (ref 38–126)
Anion gap: 8 (ref 5–15)
BUN: 8 mg/dL (ref 6–20)
CO2: 27 mmol/L (ref 22–32)
Calcium: 8.9 mg/dL (ref 8.9–10.3)
Chloride: 102 mmol/L (ref 98–111)
Creatinine, Ser: 1.15 mg/dL (ref 0.61–1.24)
GFR, Estimated: >60 mL/min (ref >60)
Glucose, Bld: 91 mg/dL (ref 70–99)
Potassium: 3.8 mmol/L (ref 3.5–5.1)
Sodium: 137 mmol/L (ref 135–145)
Total Bilirubin: 0.7 mg/dL (ref <1.2)
Total Protein: 6.4 g/dL — ABNORMAL LOW (ref 6.5–8.1)

## 2023-07-10 LAB — CBC
HCT: 40.8 % (ref 39.0–52.0)
Hemoglobin: 14.1 g/dL (ref 13.0–17.0)
MCH: 33.1 pg (ref 26.0–34.0)
MCHC: 34.6 g/dL (ref 30.0–36.0)
MCV: 95.8 fL (ref 80.0–100.0)
Platelets: 279 10*3/uL (ref 150–400)
RBC: 4.26 MIL/uL (ref 4.22–5.81)
RDW: 15.3 % (ref 11.5–15.5)
WBC: 8.5 10*3/uL (ref 4.0–10.5)
nRBC: 0 % (ref 0.0–0.2)

## 2023-07-10 LAB — TROPONIN I (HIGH SENSITIVITY): Troponin I (High Sensitivity): 3 ng/L (ref <18)

## 2023-07-10 LAB — LIPASE, BLOOD: Lipase: 54 U/L — ABNORMAL HIGH (ref 11–51)

## 2023-07-10 MED ORDER — FUROSEMIDE 20 MG PO TABS: 20.0000 mg | ORAL_TABLET | Freq: Every day | ORAL | 0 refills | Status: DC

## 2023-07-10 NOTE — ED Provider Notes (Signed)
[place] EMERGENCY DEPARTMENT AT [facility] Provider Note   CSN: [id number] Arrival date & time: 07/10/23  1927     History  Chief Complaint  Patient presents with   Leg Swelling    MCCRAE CHANN is a 57 y.o. male.  HPI   Patient presents ED with complaints of lower laying and breath.  Patient states he is.  He is always had some swelling in his right foot ever since since a previous injury but over the last several weeks he has had increasing swelling bilaterally.  Patient's had some pain in the right side of his upper abdomen chest earlier but denies any abdominal pain now.  He denies any chest pain now.  He has been feeling somewhat short of breath intermittently.  Home Medications Prior to Admission medications   Medication Sig Start Date End Date Taking? Authorizing Provider  furosemide (LASIX) 20 MG tablet Take 1 tablet (20 mg total) by mouth daily. 07/10/23  Yes Linwood Dibbles, MD  albuterol (VENTOLIN HFA) 108 (90 Base) MCG/ACT inhaler Inhale 2 puffs into the lungs every 6 (six) hours as needed for wheezing or shortness of breath. 04/02/23   Alveria Apley, NP  doxycycline (VIBRAMYCIN) 100 MG capsule Take 1 capsule (100 mg total) by mouth 2 (two) times daily. 06/01/23   Nafziger, Kandee Keen, NP  gabapentin (NEURONTIN) 300 MG capsule Take 1 capsule (300 mg total) by mouth 3 (three) times daily. 04/02/23   Alveria Apley, NP  hydrOXYzine (VISTARIL) 25 MG capsule TAKE 1 TO 2 CAPSULES BY MOUTH EVERY NIGHT AT BEDTIME AS NEEDED FOR ANXIETY AND FOR INSOMNIA 05/08/23   Alveria Apley, NP  nicotine (NICODERM CQ) 21 mg/24hr patch Place 1 patch (21 mg total) onto the skin daily. 03/02/23   Alveria Apley, NP      Allergies    Tramadol    Review of Systems   Review of Systems  Physical Exam Updated Vital Signs BP 94/69   Pulse 71   Temp 99.5 F (37.5 C) (Oral)   Resp (!) 21   SpO2 93%  Physical Exam Vitals and nursing note reviewed.   Constitutional:      General: He is not in acute distress.    Appearance: He is well-developed.  HENT:     Head: Normocephalic and atraumatic.     Right Ear: External ear normal.     Left Ear: External ear normal.  Eyes:     General: No scleral icterus.       Right eye: No discharge.        Left eye: No discharge.     Conjunctiva/sclera: Conjunctivae normal.  Neck:     Trachea: No tracheal deviation.  Cardiovascular:     Rate and Rhythm: Normal rate and regular rhythm.  Pulmonary:     Effort: Pulmonary effort is normal. No respiratory distress.     Breath sounds: Normal breath sounds. No stridor. No wheezing or rales.  Abdominal:     General: Bowel sounds are normal. There is no distension.     Palpations: Abdomen is soft.     Tenderness: There is no abdominal tenderness. There is no guarding or rebound.  Musculoskeletal:        General: No tenderness or deformity.     Cervical back: Neck supple.     Right lower leg: Edema present.     Left lower leg: Edema present.     Comments: Pitting edema bilateral lower extremities right greater  than left  Skin:    General: Skin is warm and dry.     Findings: No rash.  Neurological:     General: No focal deficit present.     Mental Status: He is alert.     Cranial Nerves: No cranial nerve deficit, dysarthria or facial asymmetry.     Sensory: No sensory deficit.     Motor: No abnormal muscle tone or seizure activity.     Coordination: Coordination normal.  Psychiatric:        Mood and Affect: Mood normal.     ED Results / Procedures / Treatments   Labs (all labs ordered are listed, but only abnormal results are displayed) Labs Reviewed  COMPREHENSIVE METABOLIC PANEL - Abnormal; Notable for the following components:      Result Value   Total Protein 6.4 (*)    AST 12 (*)    All other components within normal limits  LIPASE, BLOOD - Abnormal; Notable for the following components:   Lipase 54 (*)    All other components  within normal limits  CBC  BRAIN NATRIURETIC PEPTIDE  TROPONIN I (HIGH SENSITIVITY)  TROPONIN I (HIGH SENSITIVITY)    EKG EKG Interpretation Date/Time:  Tuesday July 10 2023 21:10:19 EST Ventricular Rate:  67 PR Interval:  150 QRS Duration:  91 QT Interval:  405 QTC Calculation: 428 R Axis:   87  Text Interpretation: Sinus rhythm Confirmed by Linwood Dibbles [phone]) on 07/10/2023 9:24:14 PM  Radiology DG Chest Portable 1 View  Result Date: 07/10/2023 CLINICAL DATA:  Dyspnea EXAM: PORTABLE CHEST 1 VIEW COMPARISON:  02/13/2023 FINDINGS: The heart size and mediastinal contours are within normal limits. Both lungs are clear. The visualized skeletal structures are unremarkable. IMPRESSION: No active disease. Electronically Signed   By: Jasmine Pang M.D.   On: 07/10/2023 21:29   US Venous Img Lower Bilateral (DVT)  Result Date: 07/10/2023 CLINICAL DATA:  57 year old male with lower extremity swelling. EXAM: BILATERAL LOWER EXTREMITY VENOUS DOPPLER ULTRASOUND TECHNIQUE: Gray-scale sonography with graded compression, as well as color Doppler and duplex ultrasound were performed to evaluate the lower extremity deep venous systems from the level of the common femoral vein and including the common femoral, femoral, profunda femoral, popliteal and calf veins including the posterior tibial, peroneal and gastrocnemius veins when visible. The superficial great saphenous vein was also interrogated. Spectral Doppler was utilized to evaluate flow at rest and with distal augmentation maneuvers in the common femoral, femoral and popliteal veins. COMPARISON:  None Available. FINDINGS: RIGHT LOWER EXTREMITY Common Femoral Vein: No evidence of thrombus. Normal compressibility, respiratory phasicity and response to augmentation. Saphenofemoral Junction: No evidence of thrombus. Normal compressibility and flow on color Doppler imaging. Profunda Femoral Vein: No evidence of thrombus. Normal compressibility and  flow on color Doppler imaging. Femoral Vein: No evidence of thrombus. Normal compressibility, respiratory phasicity and response to augmentation. Popliteal Vein: No evidence of thrombus. Normal compressibility, respiratory phasicity and response to augmentation. Calf Veins: No evidence of thrombus. Normal compressibility and flow on color Doppler imaging. Other Findings:  Subcutaneous edema is noted about the calf. LEFT LOWER EXTREMITY Common Femoral Vein: No evidence of thrombus. Normal compressibility, respiratory phasicity and response to augmentation. Saphenofemoral Junction: No evidence of thrombus. Normal compressibility and flow on color Doppler imaging. Profunda Femoral Vein: No evidence of thrombus. Normal compressibility and flow on color Doppler imaging. Femoral Vein: No evidence of thrombus. Normal compressibility, respiratory phasicity and response to augmentation. Popliteal Vein: No evidence of thrombus. Normal  compressibility, respiratory phasicity and response to augmentation. Calf Veins: No evidence of thrombus. Normal compressibility and flow on color Doppler imaging. Other Findings:  Subcutaneous edema is noted about the calf. IMPRESSION: No evidence of bilateral lower extremity deep venous thrombosis. Marliss Coots, MD [facility] Electronically Signed   By: Marliss Coots M.D.   On: 07/10/2023 21:28    Procedures Procedures    Medications Ordered in ED Medications - No data to display  ED Course/ Medical Decision Making/ A&P Clinical Course as of 07/10/23 2224  Tue Jul 10, 2023  2117 CBC normal.  Metabolic panel no significant abnormalities.  Lipase mildly elevated but doubt clinically significant.  Troponin normal. [JK]  2154 Chest x-ray without acute findings. [JK]  2154 Ultrasound without evidence of DVT [JK]    Clinical Course User Index [JK] Linwood Dibbles, MD                                 Medical Decision  Making Problems Addressed: Peripheral edema: acute illness or injury that poses a threat to life or bodily functions  Amount and/or Complexity of Data Reviewed Labs: ordered. Decision-making details documented in ED Course. Radiology: ordered and independent interpretation performed.  Risk Prescription drug management.   Patient presented to the ED with complaints of lower extremity swelling.  Patient also reports some mild shortness of breath earlier.  Reports some pain in the side but is not currently having any chest pain or abdominal pain.  ED workup reassuring.  Patient's troponin is normal.  No signs of acute coronary syndrome.  Patient has normal BNP.  Chest x-ray does not show pulmonary edema.  Patient is not anemic.  No signs of acute renal failure.  He does not have any evidence of hepatitis or hyperbilirubinemia.  Patient's Doppler studies does not show any signs of DVT.  Will have him start diuretic as an outpatient.  Wear compression stockings.  Outpatient referral to cardiology.  Patient's blood pressure was low normal here.  He is asymptomatic not tachycardic.  He is not having any fevers chills vomiting or diarrhea.  Will have him follow-up with his primary care doctor to be rechecked.  I do not think that he requires hospitalization at this time        Final Clinical Impression(s) / ED Diagnoses Final diagnoses:  Peripheral edema    Rx / DC Orders ED Discharge Orders          Ordered    Ambulatory referral to Cardiology       Comments: If you have not heard from the Cardiology office within the next 72 hours please call [phone].   07/10/23 2221    furosemide (LASIX) 20 MG tablet  Daily        07/10/23 2221              Linwood Dibbles, MD 07/10/23 2224

## 2023-07-10 NOTE — ED Triage Notes (Signed)
Patient presents with bilat lower ext swelling x few weeks. Patient also c/o "right side pain". States "felt short of breath earlier". Denies chest pain

## 2023-07-10 NOTE — Discharge Instructions (Addendum)
Take the diuretic medication to help with her leg swelling.  Wear compression stockings during the day to help with the swelling.  You can remove them at night.  Follow-up with your primary care doctor to be rechecked.  I have also placed a referral to a cardiologist regarding your leg swelling.  Your blood pressure was little low this evening.  Return to the ED if you start having worsening symptoms lightheadedness.

## 2023-07-17 DIAGNOSIS — F1290 Cannabis use, unspecified, uncomplicated: Secondary | ICD-10-CM | POA: Diagnosis not present

## 2023-07-17 DIAGNOSIS — F3181 Bipolar II disorder: Secondary | ICD-10-CM | POA: Diagnosis not present

## 2023-07-17 DIAGNOSIS — F411 Generalized anxiety disorder: Secondary | ICD-10-CM | POA: Diagnosis not present

## 2023-07-20 DIAGNOSIS — H4089 Other specified glaucoma: Secondary | ICD-10-CM | POA: Diagnosis not present

## 2023-07-20 DIAGNOSIS — H40052 Ocular hypertension, left eye: Secondary | ICD-10-CM | POA: Diagnosis not present

## 2023-07-20 DIAGNOSIS — H2512 Age-related nuclear cataract, left eye: Secondary | ICD-10-CM | POA: Diagnosis not present

## 2023-07-20 DIAGNOSIS — H40053 Ocular hypertension, bilateral: Secondary | ICD-10-CM | POA: Diagnosis not present

## 2023-07-20 DIAGNOSIS — F418 Other specified anxiety disorders: Secondary | ICD-10-CM | POA: Diagnosis not present

## 2023-07-31 ENCOUNTER — Encounter (HOSPITAL_COMMUNITY): Payer: Self-pay

## 2023-07-31 ENCOUNTER — Emergency Department (HOSPITAL_COMMUNITY): Payer: 59

## 2023-07-31 ENCOUNTER — Emergency Department (HOSPITAL_COMMUNITY)
Admission: EM | Admit: 2023-07-31 | Discharge: 2023-07-31 | Disposition: A | Discharge status: Home / Self Care | Payer: 59 | Attending: Emergency Medicine | Admitting: Emergency Medicine

## 2023-07-31 DIAGNOSIS — M7989 Other specified soft tissue disorders: Secondary | ICD-10-CM | POA: Diagnosis not present

## 2023-07-31 DIAGNOSIS — R600 Localized edema: Secondary | ICD-10-CM | POA: Insufficient documentation

## 2023-07-31 DIAGNOSIS — R9431 Abnormal electrocardiogram [ECG] [EKG]: Secondary | ICD-10-CM | POA: Diagnosis not present

## 2023-07-31 DIAGNOSIS — M25572 Pain in left ankle and joints of left foot: Secondary | ICD-10-CM | POA: Diagnosis not present

## 2023-07-31 DIAGNOSIS — M25562 Pain in left knee: Secondary | ICD-10-CM | POA: Diagnosis not present

## 2023-07-31 LAB — CBC WITH DIFFERENTIAL/PLATELET
Abs Immature Granulocytes: 0.02 10*3/uL (ref 0.00–0.07)
Basophils Absolute: 0.1 10*3/uL (ref 0.0–0.1)
Basophils Relative: 1 %
Eosinophils Absolute: 0.6 10*3/uL — ABNORMAL HIGH (ref 0.0–0.5)
Eosinophils Relative: 7 %
HCT: 44 % (ref 39.0–52.0)
Hemoglobin: 15.2 g/dL (ref 13.0–17.0)
Immature Granulocytes: 0 %
Lymphocytes Relative: 26 %
Lymphs Abs: 2.4 10*3/uL (ref 0.7–4.0)
MCH: 33.6 pg (ref 26.0–34.0)
MCHC: 34.5 g/dL (ref 30.0–36.0)
MCV: 97.3 fL (ref 80.0–100.0)
Monocytes Absolute: 0.8 10*3/uL (ref 0.1–1.0)
Monocytes Relative: 9 %
Neutro Abs: 5.3 10*3/uL (ref 1.7–7.7)
Neutrophils Relative %: 57 %
Platelets: 289 10*3/uL (ref 150–400)
RBC: 4.52 MIL/uL (ref 4.22–5.81)
RDW: 15.8 % — ABNORMAL HIGH (ref 11.5–15.5)
WBC: 9.2 10*3/uL (ref 4.0–10.5)
nRBC: 0 % (ref 0.0–0.2)

## 2023-07-31 LAB — COMPREHENSIVE METABOLIC PANEL
ALT: 11 U/L (ref 0–44)
AST: 16 U/L (ref 15–41)
Albumin: 3.4 g/dL — ABNORMAL LOW (ref 3.5–5.0)
Alkaline Phosphatase: 48 U/L (ref 38–126)
Anion gap: 10 (ref 5–15)
BUN: 11 mg/dL (ref 6–20)
CO2: 22 mmol/L (ref 22–32)
Calcium: 9 mg/dL (ref 8.9–10.3)
Chloride: 103 mmol/L (ref 98–111)
Creatinine, Ser: 1.05 mg/dL (ref 0.61–1.24)
GFR, Estimated: >60 mL/min (ref >60)
Glucose, Bld: 106 mg/dL — ABNORMAL HIGH (ref 70–99)
Potassium: 3.6 mmol/L (ref 3.5–5.1)
Sodium: 135 mmol/L (ref 135–145)
Total Bilirubin: 0.9 mg/dL (ref <1.2)
Total Protein: 6.6 g/dL (ref 6.5–8.1)

## 2023-07-31 LAB — BRAIN NATRIURETIC PEPTIDE: B Natriuretic Peptide: 28.7 pg/mL (ref 0.0–100.0)

## 2023-07-31 MED ORDER — KETOROLAC TROMETHAMINE 15 MG/ML IJ SOLN
15.0000 mg | Freq: Once | INTRAMUSCULAR | Status: AC
  Administered 2023-07-31: 15 mg via INTRAVENOUS

## 2023-07-31 MED ORDER — KETOROLAC TROMETHAMINE 15 MG/ML IJ SOLN
15.0000 mg | Freq: Once | INTRAMUSCULAR | Status: DC
  Filled 2023-07-31: qty 1

## 2023-07-31 MED ORDER — FUROSEMIDE 20 MG PO TABS: 20.0000 mg | ORAL_TABLET | Freq: Every day | ORAL | 0 refills | Status: DC

## 2023-07-31 MED ORDER — CELECOXIB 200 MG PO CAPS: 200.0000 mg | ORAL_CAPSULE | Freq: Two times a day (BID) | ORAL | 0 refills | Status: DC

## 2023-07-31 MED ORDER — POTASSIUM CHLORIDE CRYS ER 20 MEQ PO TBCR: 20.0000 meq | EXTENDED_RELEASE_TABLET | Freq: Once | ORAL | 0 refills | Status: DC

## 2023-07-31 MED ORDER — OXYCODONE HCL 5 MG PO TABS
5.0000 mg | ORAL_TABLET | Freq: Once | ORAL | Status: AC
  Administered 2023-07-31: 5 mg via ORAL
  Filled 2023-07-31: qty 1

## 2023-07-31 MED ORDER — ACETAMINOPHEN 500 MG PO TABS
1000.0000 mg | ORAL_TABLET | Freq: Once | ORAL | Status: AC
  Administered 2023-07-31: 1000 mg via ORAL
  Filled 2023-07-31: qty 2

## 2023-07-31 NOTE — ED Provider Notes (Signed)
[place] EMERGENCY DEPARTMENT AT [facility] Provider Note   CSN: [id number] Arrival date & time: 07/31/23  1821     History Chief Complaint  Patient presents with   Leg Swelling    HPI Billy White is a 57 y.o. male presenting for chief complaint of bilateral lower extremity swelling.  Been going on over the past 3 months per his wife.  He denies fevers chills nausea vomiting shortness of breath.  States that is finally started to become severely uncomfortable. Denies any other symptoms.  Does not have primary care..   Patient's recorded medical, surgical, social, medication list and allergies were reviewed in the Snapshot window as part of the initial history.   Review of Systems   Review of Systems  Constitutional:  Negative for chills and fever.  HENT:  Negative for ear pain and sore throat.   Eyes:  Negative for pain and visual disturbance.  Respiratory:  Negative for cough and shortness of breath.   Cardiovascular:  Positive for leg swelling. Negative for chest pain and palpitations.  Gastrointestinal:  Negative for abdominal pain and vomiting.  Genitourinary:  Negative for dysuria and hematuria.  Musculoskeletal:  Negative for arthralgias and back pain.  Skin:  Negative for color change and rash.  Neurological:  Negative for seizures and syncope.  All other systems reviewed and are negative.   Physical Exam Updated Vital Signs BP 126/88   Pulse 80   Temp 98.9 F (37.2 C) (Oral)   Resp 17   Ht 6\' 7"  (2.007 m)   Wt 99.8 kg   SpO2 98%   BMI 24.78 kg/m  Physical Exam Vitals and nursing note reviewed.  Constitutional:      General: He is not in acute distress.    Appearance: He is well-developed.  HENT:     Head: Normocephalic and atraumatic.  Eyes:     Conjunctiva/sclera: Conjunctivae normal.  Cardiovascular:     Rate and Rhythm: Normal rate and regular rhythm.     Heart sounds: No murmur heard. Pulmonary:     Effort: Pulmonary effort  is normal. No respiratory distress.     Breath sounds: Normal breath sounds.  Abdominal:     Palpations: Abdomen is soft.     Tenderness: There is no abdominal tenderness.  Musculoskeletal:        General: No swelling.     Cervical back: Neck supple.  Skin:    General: Skin is warm and dry.     Capillary Refill: Capillary refill takes less than 2 seconds.  Neurological:     Mental Status: He is alert.  Psychiatric:        Mood and Affect: Mood normal.      ED Course/ Medical Decision Making/ A&P    Procedures Procedures   Medications Ordered in ED Medications  oxyCODONE (Oxy IR/ROXICODONE) immediate release tablet 5 mg (5 mg Oral Given 07/31/23 2015)  acetaminophen (TYLENOL) tablet 1,000 mg (1,000 mg Oral Given 07/31/23 2212)  ketorolac (TORADOL) 15 MG/ML injection 15 mg (15 mg Intravenous Given 07/31/23 2230)    Medical Decision Making:   57 year old male presenting with a chief complaint of bilateral lower extremity swelling. Denies fevers chills nausea vomiting syncope shortness of breath.  No known sick contacts.  History of present illness and physical exam findings most consistent with likely dependent edema versus nonspecific etiology. Considered heart failure, nephrotic syndrome, liver failure.  These seem grossly inconsistent with presentation but will check with lab work  for more concerning pathology. Reassessment: Lab work including CBC, CMP, BNP all reviewed without focal pathology.  Patient well-appearing ambulatory tolerating p.o. intake in no acute distress and I believe patient can be safely followed up in outpatient setting.  Lasix for 3 days  Clinical Impression:  1. Leg edema      Discharge   Final Clinical Impression(s) / ED Diagnoses Final diagnoses:  Leg edema    Rx / DC Orders ED Discharge Orders          Ordered    furosemide (LASIX) 20 MG tablet  Daily        07/31/23 2247    celecoxib (CELEBREX) 200 MG capsule  2 times daily         07/31/23 2247    potassium chloride SA (KLOR-CON M) 20 MEQ tablet   Once        07/31/23 2248              Glyn Ade, MD 07/31/23 2349

## 2023-07-31 NOTE — ED Triage Notes (Addendum)
Patient has had left leg swelling for 1 week. Painful to walk. Takes magnesium. Cramping up in his upper leg. No falls. Complaining of a rash around his bellybutton for 2 weeks. Had xray done at [facility] for same leg pain but it was negative.

## 2023-09-24 ENCOUNTER — Other Ambulatory Visit: Payer: Self-pay | Admitting: *Deleted

## 2023-09-24 DIAGNOSIS — Z87891 Personal history of nicotine dependence: Secondary | ICD-10-CM

## 2023-09-24 DIAGNOSIS — Z122 Encounter for screening for malignant neoplasm of respiratory organs: Secondary | ICD-10-CM

## 2023-09-26 ENCOUNTER — Telehealth: Payer: Self-pay | Admitting: Acute Care

## 2023-09-26 NOTE — Telephone Encounter (Signed)
Left VM for patient to call for change in appointment. Per [facility] Lorrene Reid), the LDCT should be changed to a [facility] facility

## 2023-09-28 NOTE — Telephone Encounter (Signed)
Patient called in, rescheduled patient from [facility] to [facility]. Patient aware of time change and location of imaging center. Nothing further needed at this time.

## 2023-10-01 ENCOUNTER — Inpatient Hospital Stay: Payer: 59 | Attending: Family Medicine

## 2023-10-01 ENCOUNTER — Inpatient Hospital Stay: Payer: 59 | Admitting: Internal Medicine

## 2023-10-11 ENCOUNTER — Encounter: Payer: Self-pay | Admitting: Adult Health

## 2023-10-11 ENCOUNTER — Ambulatory Visit (INDEPENDENT_AMBULATORY_CARE_PROVIDER_SITE_OTHER): Payer: 59 | Admitting: Adult Health

## 2023-10-11 DIAGNOSIS — Z87891 Personal history of nicotine dependence: Secondary | ICD-10-CM

## 2023-10-11 NOTE — Progress Notes (Signed)
  Virtual Visit via Telephone Note  I connected with Billy White , 10/11/23 10:14 AM by a telemedicine application and verified that I am speaking with the correct person using two identifiers.  Location: Patient: home Provider: home   I discussed the limitations of evaluation and management by telemedicine and the availability of in person appointments. The patient expressed understanding and agreed to proceed.   Shared Decision Making Visit Lung Cancer Screening Program [phone])   Eligibility: 58 y.o. Pack Years Smoking History Calculation = 25 pack years  (# packs/per year x # years smoked) Recent History of coughing up blood  no Unexplained weight loss? no ( >Than 15 pounds within the last 6 months ) Prior History Lung / other cancer no (Diagnosis within the last 5 years already requiring surveillance chest CT Scans). Smoking Status Former Smoker Former Smokers: Years since quit: 6 years  Quit Date: 2019  Visit Components: Discussion included one or more decision making aids. YES Discussion included risk/benefits of screening. YES Discussion included potential follow up diagnostic testing for abnormal scans. YES Discussion included meaning and risk of over diagnosis. YES Discussion included meaning and risk of False Positives. YES Discussion included meaning of total radiation exposure. YES  Counseling Included: Importance of adherence to annual lung cancer LDCT screening. YES Impact of comorbidities on ability to participate in the program. YES Ability and willingness to under diagnostic treatment. YES  Smoking Cessation Counseling: Former Smokers:  Discussed the importance of maintaining cigarette abstinence. yes Diagnosis Code: Personal History of Nicotine Dependence. U04.540 Information about tobacco cessation classes and interventions provided to patient. Yes Patient provided with "ticket" for LDCT Scan. yes Written Order for Lung Cancer Screening with LDCT  placed in Epic. Yes (CT Chest Lung Cancer Screening Low Dose W/O CM) [id number]  Z12.2-Screening of respiratory organs Z87.891-Personal history of nicotine dependence   [name] 10/11/23

## 2023-10-11 NOTE — Patient Instructions (Signed)

## 2023-10-12 ENCOUNTER — Ambulatory Visit (HOSPITAL_BASED_OUTPATIENT_CLINIC_OR_DEPARTMENT_OTHER): Payer: 59

## 2023-10-12 ENCOUNTER — Inpatient Hospital Stay: Admission: RE | Admit: 2023-10-12 | Payer: 59 | Source: Ambulatory Visit

## 2023-11-09 ENCOUNTER — Ambulatory Visit (HOSPITAL_BASED_OUTPATIENT_CLINIC_OR_DEPARTMENT_OTHER)

## 2023-12-28 ENCOUNTER — Ambulatory Visit: Payer: Self-pay

## 2023-12-28 NOTE — Telephone Encounter (Signed)
 Copied from CRM [id number]. Topic: Clinical - Red Word Triage >> Dec 28, 2023  2:42 PM Luane Rumps D wrote: Red Word that prompted transfer to Nurse Triage: High blood pressure, 174/100 yesterday and rash on stomach for a month that is very itchy.  Chief Complaint: Elevated Blood Pressure  Symptoms: Dyspnea  Frequency: Acute, Yesterday  Pertinent Negatives: Patient denies dizziness, headaches, chest pain, oor  Disposition: [] ED /[] Urgent Care (no appt availability in office) / [] Appointment(In office/virtual)/ []  [place] [facility]/ [] Home Care/ [] Refused Recommended Disposition /[] [facility]/ []  Follow-up with PCP Additional Notes: RH is being triaged for elevated blood pressure that was/elevated, the reading was 174/100. The patient states he is a smoker.    Reason for Disposition  Systolic BP  >= 160 OR Diastolic >= 100  Answer Assessment - Initial Assessment Questions 1. BLOOD PRESSURE: "What is the blood pressure?" "Did you take at least two measurements 5 minutes apart?"     174/100  2. ONSET: "When did you take your blood pressure?"      Yesterday morning  3. HOW: "How did you take your blood pressure?" (e.g., automatic home BP monitor, visiting nurse)     At a plasma center, machine took the blood pressure.  4. HISTORY: "Do you have a history of high blood pressure?"     Has had elevations in the past  5. MEDICINES: "Are you taking any medicines for blood pressure?" "Have you missed any doses recently?"     No  6. OTHER SYMPTOMS: "Do you have any symptoms?" (e.g., blurred vision, chest pain, difficulty breathing, headache, weakness)     Dyspnea due to smoking  Protocols used: Blood Pressure - High-A-AH

## 2023-12-31 ENCOUNTER — Other Ambulatory Visit: Payer: Self-pay | Admitting: Family Medicine

## 2023-12-31 ENCOUNTER — Encounter: Payer: Self-pay | Admitting: Family Medicine

## 2023-12-31 ENCOUNTER — Ambulatory Visit (INDEPENDENT_AMBULATORY_CARE_PROVIDER_SITE_OTHER): Admitting: Family Medicine

## 2023-12-31 VITALS — BP 138/82 | HR 70 | Temp 98.8°F | Ht 79.0 in | Wt 206.0 lb

## 2023-12-31 DIAGNOSIS — L309 Dermatitis, unspecified: Secondary | ICD-10-CM

## 2023-12-31 DIAGNOSIS — K219 Gastro-esophageal reflux disease without esophagitis: Secondary | ICD-10-CM

## 2023-12-31 DIAGNOSIS — Z0130 Encounter for examination of blood pressure without abnormal findings: Secondary | ICD-10-CM

## 2023-12-31 MED ORDER — TRIAMCINOLONE ACETONIDE 0.1 % EX CREA: 1.0000 | TOPICAL_CREAM | Freq: Two times a day (BID) | CUTANEOUS | 0 refills | Status: AC

## 2023-12-31 MED ORDER — OMEPRAZOLE 20 MG PO CPDR
20.0000 mg | DELAYED_RELEASE_CAPSULE | Freq: Every day | ORAL | 3 refills | Status: AC
Start: 2023-12-31 — End: ?

## 2023-12-31 NOTE — Patient Instructions (Addendum)
-  It was good to see you today.  -Prescribed Omeprazole  20mg  daily for GERD symptoms. Discussed about how best to take medication. -Prescribed Triamcinolone  Cream to rash on your abdomen. Recommend to apply a thin layer twice a day.  -Blood pressure is not elevated in office today despite being told it is elevated when donating plasma. Recommend to monitor your blood pressure twice a day, once in the AM and once in the PM. Write down reading on blank form provided. Either drop off readings in 2 weeks or upload to MyChart. Once reading are reviewed, will follow up based on readings.  -Follow up in 3 months for a physical or sooner if needed. Please be fasting at physical appointment.

## 2023-12-31 NOTE — Progress Notes (Signed)
 Established Patient Office Visit   Subjective:  Patient ID: Billy White, male    DOB: 1965/10/25  Age: 58 y.o. MRN: [id number]  Chief Complaint  Patient presents with   Acute Visit    Rash on abd x2 weeks  Elevated bp     HPI HTN: Patient reports he donates plasma 1-2 times a week. He was told at his last donation, this past Saturday, that his blood pressure was elevated, 174/106. However, the last three readings Epic has recorded is normal. Denies CP, HA, SHOB, dizziness, and lower extremity edema.   BP Readings from Last 3 Encounters:  12/31/23 138/82  07/31/23 126/88  07/10/23 92/63    Rash: Mid abd, along the belt line. Present for a couple of months. When he does not wear a belt, the rash will improve. Itches when rash is present.   GERD: Patient is experiencing GERD symptoms. Burning in mid chest when lying down at night from eating supper. He reports he has previously took medication and it relieved symptoms.   ROS See HPI above     Objective:     BP 138/82   Pulse 70   Temp 98.8 F (37.1 C) (Oral)   Ht 6\' 7"  (2.007 m)   Wt 206 lb (93.4 kg)   SpO2 95%   BMI 23.21 kg/m    Physical Exam Vitals reviewed.  Constitutional:      General: He is not in acute distress.    Appearance: Normal appearance. He is not ill-appearing, toxic-appearing or diaphoretic.  HENT:     Head: Normocephalic and atraumatic.  Eyes:     General:        Right eye: No discharge.        Left eye: No discharge.     Conjunctiva/sclera: Conjunctivae normal.  Cardiovascular:     Rate and Rhythm: Normal rate and regular rhythm.  Pulmonary:     Effort: Pulmonary effort is normal. No respiratory distress.  Musculoskeletal:        General: Normal range of motion.  Skin:    General: Skin is warm and dry.     Findings: Rash (See picture included.) present.  Neurological:     General: No focal deficit present.     Mental Status: He is alert and oriented to person, place, and time.  Mental status is at baseline.  Psychiatric:        Mood and Affect: Mood normal.        Behavior: Behavior normal.        Thought Content: Thought content normal.        Judgment: Judgment normal.        Assessment & Plan:  Gastroesophageal reflux disease without esophagitis -     Omeprazole ; Take 1 capsule (20 mg total) by mouth daily.  Dispense: 30 capsule; Refill: 3  Dermatitis -     Triamcinolone  Acetonide; Apply 1 Application topically 2 (two) times daily.  Dispense: 30 g; Refill: 0  Examination of blood pressure  -Prescribed Omeprazole  20mg  daily for GERD symptoms. Discussed about how best to take medication. -Prescribed Triamcinolone  Cream to rash on your abdomen. Recommend to apply a thin layer twice a day.  -Blood pressure is not elevated in office today despite being told it is elevated when donating plasma. Recommend to monitor your blood pressure twice a day, once in the AM and once in the PM. Write down reading on blank form provided. Either drop off readings in 2 weeks  or upload to MyChart. Once reading are reviewed, will follow up based on readings.  -Follow up in 3 months for a physical or sooner if needed. Please be fasting at physical appointment.   Return in about 3 months (around 04/01/2024) for physical.   Carling Liberman, NP

## 2024-04-02 ENCOUNTER — Encounter: Admitting: Family Medicine

## 2024-04-08 NOTE — Telephone Encounter (Signed)
 Patient has been a no show for two combination fibroscan and hepatology follow-up appointments within the past 6 months. A formal letter outlining missed appointment policy and potential dismissal from the practice was sent to the patient. Letter includes notice that if no contact is made within 3 months, we will assume the patient is no longer seeking care and will not initiate further outreach. Future visits will require a new referral and updated records.  Letter was cc'ed to the patient's care team.

## 2024-04-15 ENCOUNTER — Encounter: Payer: Self-pay | Admitting: Acute Care

## 2024-06-09 NOTE — Progress Notes (Signed)
 Billy White is a pleasant 58 y.o. male who presents for evaluation of 4 months of left greater than right low back pain that radiates into the lateral aspect of the leg all the way to the ankle.  His pain describes as sharp and severe with associated tingling.  He has frequent cramping in his feet.  Pain is worsened with standing and activity.  He has tried Norco, Tylenol , ibuprofen , heat, ice without benefit.  Denies bowel or bladder dysfunction.  Denies saddle anesthesia.  Denies gait instability. Denies other complaints.    Past Medical History:  Diagnosis Date  . Arthritis     Past Surgical History:  Procedure Laterality Date  . Knee surgery Right   . Nose surgery        Family History  Problem Relation Age of Onset  . Hypertension Father   . Heart attack Paternal Uncle     Allergies[1]  Current Medications[2]   Pertinent items are noted in HPI.  Exam: Vitals:   06/09/24 1323  BP: (!) 151/95  Pulse:   Temp:   SpO2:     General appearance: Alert, cooperative, no apparent distress, appears stated age  Head: Normocephalic, without obvious abnormality, atraumatic Eyes: Conjunctive and corneas are clear, extraocular movements are intact bilaterally Neck: Supple, symmetrical, no meningismus, trachea midline Extremities: Normal, atraumatic extremities with no stenosis, clubbing, or edema Pulses: 2+ and symmetric in all extremities Skin: Warm, dry, free of rashes or other lesions to exposed skin   Neurologic: Strength is 5/5 by segmental testing, exacerbation of back pain with strength testing of left leg.  Sensation is intact to all modalities tested.   Straight leg test is positive on the left.  Patrick's maneuver is negative bilaterally.  Deep tendon reflexes 2+ with trace patellar reflexes bilaterally.  Ambulation is normal.  Speech is fluent.  Cranial nerves II through XII are grossly intact.  Cerebellar testing is benign.   X-ray imaging lumbar spine  05/20/2024 shows anterosuperior compression deformity of T12 vertebral body with less than 25% height loss.  No retropulsion.  X-ray imaging pelvis and left hip 05/20/2024 shows no acute osseous abnormality.  Mild bilateral hip osteonecrosis superior joint space loss and chondral thinning.   Assessment and plan: Examination and history are consistent with 4 months of left greater than right low back pain radiating to the lateral aspect of the leg to the ankle.  He has failed conservative measures thus far.  Recommend MRI imaging lumbar spine to further assess and guide decision made for treatment options.  He does not describe any recent traumatic injury.  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  I have spent over 45 minutes in evaluation, management, and counseling today. The majority of the time did relate to counseling regarding discussion with him of his x-ray imaging results and discussion of options and alternatives for further evaluation and potential intervention.   (Please note voice recognition software has been used to dictate this note. Similar sounding words can inadvertently be transcribed and may not be corrected upon review).       [1] Allergies Allergen Reactions  . Tramadol  Itching  [2]  Current Outpatient Medications:  .  benzonatate (TESSALON) 100 mg capsule, Take one capsule to two capsules (100-200 mg dose) by mouth every 8 (eight) hours as needed for Cough. (Patient not taking: Reported on 08/13/2018), Disp: 30 capsule, Rfl: 0 .  citalopram  (CELEXA ) 20 MG tablet, Take 20 mg by mouth  daily. (Patient not taking: Reported on 06/09/2024), Disp: , Rfl:  .  HYDROmorphone  (DILAUDID ) 2 mg tablet, Take 2 mg by mouth every 4 (four) hours as needed for Pain. (Patient not taking: Reported on 06/09/2024), Disp: , Rfl:  .  ibuprofen  (ADVIL ,MOTRIN ) 800 mg tablet, Take one tablet (800 mg total) by mouth 3 (three) times a day. Take between doses of tramadol   to prevent breakthrough pain, Disp: 20 tablet, Rfl: 0 .  Magnesium  200 MG TABS, Take one tablet (200 mg dose) by mouth daily., Disp: , Rfl:  .  meloxicam  (MOBIC ) 7.5 MG tablet, Take 7.5 mg by mouth daily. (Patient not taking: Reported on 06/09/2024), Disp: , Rfl:  .  omeprazole  (PRILOSEC) 40 MG capsule, Take 40 mg by mouth daily. (Patient not taking: Reported on 06/09/2024), Disp: , Rfl:  .  oxyCODONE  HCl (ROXICODONE ) 5 mg immediate release tablet, Take 1 tablet (5 mg total) by mouth every 6 (six) hours as needed for Pain. (Patient not taking: Reported on 08/13/2018), Disp: 50 tablet, Rfl: 0 .  silver sulfADIAZINE (SILVADENE,SSD) 1% cream, Apply topically 2 (two) times daily. (Patient not taking: Reported on 06/09/2024), Disp: 50 g, Rfl: 0

## 2024-07-08 ENCOUNTER — Other Ambulatory Visit: Payer: Self-pay

## 2024-07-08 ENCOUNTER — Emergency Department (HOSPITAL_COMMUNITY)
Admission: EM | Admit: 2024-07-08 | Discharge: 2024-07-08 | Disposition: A | Discharge status: Home / Self Care | Attending: Emergency Medicine | Admitting: Emergency Medicine

## 2024-07-08 DIAGNOSIS — M545 Low back pain, unspecified: Secondary | ICD-10-CM | POA: Insufficient documentation

## 2024-07-08 DIAGNOSIS — G8929 Other chronic pain: Secondary | ICD-10-CM | POA: Diagnosis not present

## 2024-07-08 MED ORDER — OXYCODONE-ACETAMINOPHEN 5-325 MG PO TABS: 1.0000 | ORAL_TABLET | Freq: Four times a day (QID) | ORAL | 0 refills | Status: AC | PRN

## 2024-07-08 MED ORDER — OXYCODONE-ACETAMINOPHEN 5-325 MG PO TABS
2.0000 | ORAL_TABLET | Freq: Once | ORAL | Status: AC
  Administered 2024-07-08: 2 via ORAL
  Filled 2024-07-08: qty 2

## 2024-07-08 NOTE — ED Triage Notes (Signed)
 PT ambulatory to triage with complaints of bilateral leg and foot pain for 6 months. PT states that he has been seen by multiple providers. PT states that he is due to have an MRI today.

## 2024-07-08 NOTE — ED Provider Notes (Signed)
 [place] EMERGENCY DEPARTMENT AT [facility] Provider Note   CSN: [id number] Arrival date & time: 07/08/24  9047     Patient presents with: Leg Pain   Billy White is a 58 y.o. male who presents to the ED today with primary complaint of left-sided lower back pain with radiculopathy down the left lower extremity.  Upon review of previous visit records from 09 June 2024, has previously used Norco, acetaminophen , ibuprofen  for pain control.  Currently under the care of [facility] spine surgery.  Primary reason for presenting today was worsened back pain overnight affecting his ability to sleep, he does have MRI imaging scheduled for today and he is seeking pain management until he is able to get to this appointment.  X-ray imaging from earlier in October 2025 did show compression deformity of T12, with mild bilateral hip osteonecrosis.  Specifically denies bowel or bladder dysfunction, denies saddle anesthesia.  Ambulates with a antalgic gait.  Complains of numbness in the left foot, radicular pain described as a burning sensation that goes across the lateral left leg into the left calf.    Leg Pain Associated symptoms: back pain        Prior to Admission medications   Medication Sig Start Date End Date Taking? Authorizing Provider  oxyCODONE -acetaminophen  (PERCOCET/ROXICET) 5-325 MG tablet Take 1 tablet by mouth every 6 (six) hours as needed for severe pain (pain score 7-10). 07/08/24  Yes Billy Dorn BROCKS, PA  albuterol  (VENTOLIN  HFA) 108 (90 Base) MCG/ACT inhaler Inhale 2 puffs into the lungs every 6 (six) hours as needed for wheezing or shortness of breath. 04/02/23   Billy Philippe SAUNDERS, NP  Magnesium  200 MG TABS Take 1 tablet by mouth daily at 6 (six) AM.    [provider]  nicotine  (NICODERM CQ ) 21 mg/24hr patch Place 1 patch (21 mg total) onto the skin daily. 03/02/23   Billy Philippe SAUNDERS, NP  omeprazole  (PRILOSEC) 20 MG capsule Take 1 capsule (20 mg  total) by mouth daily. 12/31/23   Billy Philippe SAUNDERS, NP  trazodone (DESYREL) 300 MG tablet Take 300 mg by mouth at bedtime.    [provider]  triamcinolone  cream (KENALOG ) 0.1 % Apply 1 Application topically 2 (two) times daily. 12/31/23   Billy Philippe SAUNDERS, NP    Allergies: Tramadol     Review of Systems  Musculoskeletal:  Positive for arthralgias and back pain.  All other systems reviewed and are negative.   Updated Vital Signs BP (!) 126/98 (BP Location: Left Arm)   Pulse 72   Temp 98.5 F (36.9 C) (Oral)   Resp 16   SpO2 96%   Physical Exam Vitals and nursing note reviewed.  Constitutional:      General: He is not in acute distress.    Appearance: Normal appearance.  HENT:     Head: Normocephalic and atraumatic.     Mouth/Throat:     Mouth: Mucous membranes are moist.     Pharynx: Oropharynx is clear.  Eyes:     Extraocular Movements: Extraocular movements intact.     Conjunctiva/sclera: Conjunctivae normal.     Pupils: Pupils are equal, round, and reactive to light.  Cardiovascular:     Rate and Rhythm: Normal rate and regular rhythm.     Pulses: Normal pulses.     Heart sounds: Normal heart sounds. No murmur heard.    No friction rub. No gallop.  Pulmonary:     Effort: Pulmonary effort is normal.  Breath sounds: Normal breath sounds.  Abdominal:     General: Abdomen is flat. Bowel sounds are normal.     Palpations: Abdomen is soft.  Musculoskeletal:     Cervical back: Normal range of motion and neck supple.     Lumbar back: Positive right straight leg raise test and positive left straight leg raise test.     Right lower leg: No edema.     Left lower leg: No edema.  Skin:    General: Skin is warm and dry.     Capillary Refill: Capillary refill takes less than 2 seconds.  Neurological:     General: No focal deficit present.     Mental Status: He is alert. Mental status is at baseline.  Psychiatric:        Mood and Affect: Mood normal.      (all labs ordered are listed, but only abnormal results are displayed) Labs Reviewed - No data to display  EKG: None  Radiology: No results found.   Procedures   Medications Ordered in the ED  oxyCODONE -acetaminophen  (PERCOCET/ROXICET) 5-325 MG per tablet 2 tablet (2 tablets Oral Given 07/08/24 1152)                                    Medical Decision Making Risk Prescription drug management.   Medical Decision Making:   Billy White is a 58 y.o. male who presented to the ED today with left-sided lower back pain as well as radicular pain down the left leg detailed above.    Additional history discussed with patient's family/caregivers.  External chart has been reviewed including outpatient spine surgery records. Complete initial physical exam performed, notably the patient  was alert oriented no apparent distress.  He has positive straight leg test bilaterally, ambulating with antalgic gait..    Reviewed and confirmed nursing documentation for past medical history, family history, social history.    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  There is no recent injuries or aggravating factors that would suggest acute injury, and as such we will defer imaging at this time.  Patient does have MRI imaging scheduled for later today.   Initial Plan:  Provide pain management with p.o. oxycodone . Plan to reevaluate pain medicine response. Objective evaluation as below reviewed    Reassessment and Plan:   Patient presented complaining with chronic low back pain, radicular left-sided back pain.  Was provided with oxycodone  for pain management which has successfully manage his pain.  Given this we will provide him with a outpatient course of this with him to go to his imaging appointment this afternoon.  Follow-up  with neurosurgery as scheduled, otherwise return precautions have been given which the patient verbalizes understanding and agreement at this time.  Given that he has no concerning findings on the physical evaluation or workup we will discharge with outpatient management as discussed.       Final diagnoses:  Chronic left-sided low back pain with left-sided sciatica    ED Discharge Orders          Ordered    oxyCODONE -acetaminophen  (PERCOCET/ROXICET) 5-325 MG tablet  Every 6 hours PRN        07/08/24 1230  Billy Dorn BROCKS, PA 07/08/24 1231    Ruthe Cornet, DO 07/08/24 1308
